# Patient Record
Sex: Female | Born: 1970 | Race: Asian | Hispanic: No | Marital: Married | Smoking: Never smoker
Health system: Southern US, Community
[De-identification: ages and names within clinical notes are randomized; demographics above are authoritative.]

## PROBLEM LIST (undated history)

## (undated) DIAGNOSIS — G43909 Migraine, unspecified, not intractable, without status migrainosus: Secondary | ICD-10-CM

## (undated) DIAGNOSIS — B977 Papillomavirus as the cause of diseases classified elsewhere: Secondary | ICD-10-CM

## (undated) DIAGNOSIS — IMO0002 Reserved for concepts with insufficient information to code with codable children: Secondary | ICD-10-CM

## (undated) DIAGNOSIS — J811 Chronic pulmonary edema: Secondary | ICD-10-CM

## (undated) DIAGNOSIS — I509 Heart failure, unspecified: Secondary | ICD-10-CM

## (undated) DIAGNOSIS — Z8742 Personal history of other diseases of the female genital tract: Secondary | ICD-10-CM

## (undated) DIAGNOSIS — I38 Endocarditis, valve unspecified: Secondary | ICD-10-CM

## (undated) HISTORY — DX: Personal history of other diseases of the female genital tract: Z87.42

## (undated) HISTORY — DX: Heart failure, unspecified: I50.9

## (undated) HISTORY — DX: Reserved for concepts with insufficient information to code with codable children: IMO0002

## (undated) HISTORY — DX: Migraine, unspecified, not intractable, without status migrainosus: G43.909

## (undated) HISTORY — PX: ABDOMINAL HYSTERECTOMY: SHX81

## (undated) HISTORY — PX: TUBAL LIGATION: SHX77

---
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
## 2006-04-13 ENCOUNTER — Other Ambulatory Visit: Admission: RE | Admit: 2006-04-13 | Discharge: 2006-04-13 | Payer: Self-pay | Admitting: Obstetrics and Gynecology

## 2006-11-02 ENCOUNTER — Ambulatory Visit: Payer: Self-pay | Admitting: Emergency Medicine

## 2006-11-02 ENCOUNTER — Inpatient Hospital Stay (HOSPITAL_COMMUNITY): Admission: AD | Admit: 2006-11-02 | Discharge: 2006-11-08 | Payer: Self-pay | Admitting: Obstetrics and Gynecology

## 2006-11-03 ENCOUNTER — Encounter (INDEPENDENT_AMBULATORY_CARE_PROVIDER_SITE_OTHER): Payer: Self-pay | Admitting: Specialist

## 2006-11-05 ENCOUNTER — Encounter (INDEPENDENT_AMBULATORY_CARE_PROVIDER_SITE_OTHER): Payer: Self-pay | Admitting: *Deleted

## 2006-11-09 ENCOUNTER — Encounter: Admission: RE | Admit: 2006-11-09 | Discharge: 2006-12-09 | Payer: Self-pay | Admitting: Obstetrics and Gynecology

## 2008-04-05 ENCOUNTER — Inpatient Hospital Stay (HOSPITAL_COMMUNITY): Admission: RE | Admit: 2008-04-05 | Discharge: 2008-04-09 | Payer: Self-pay | Admitting: Obstetrics and Gynecology

## 2008-04-05 ENCOUNTER — Encounter (INDEPENDENT_AMBULATORY_CARE_PROVIDER_SITE_OTHER): Payer: Self-pay | Admitting: Obstetrics and Gynecology

## 2008-04-07 ENCOUNTER — Encounter (INDEPENDENT_AMBULATORY_CARE_PROVIDER_SITE_OTHER): Payer: Self-pay | Admitting: Cardiology

## 2011-04-28 NOTE — Op Note (Signed)
[name], [name]               ACCOUNT NO.:  [id number]   MEDICAL RECORD NO.:  [id number]          PATIENT TYPE:  INP   LOCATION:  9128                          [facility]   PHYSICIAN:  Hal Morales, M.D.DATE OF BIRTH:  [birth year]   DATE OF PROCEDURE:  04/05/2008  DATE OF DISCHARGE:                               OPERATIVE REPORT   PREOPERATIVE DIAGNOSES:  1. Intrauterine pregnancy at 49 weeks' gestation.  2. Prior cesarean section.  3. Desire for repeat cesarean section.  4. Desire for surgical sterilization.  5. History of postpartum congestive heart failure secondary to mild      mitral regurgitation.   POSTOPERATIVE DIAGNOSES:  1. Intrauterine pregnancy at 15 weeks' gestation.  2. Prior cesarean section.  3. Desire for repeat cesarean section.  4. Desire for surgical sterilization.  5. History of postpartum congestive heart failure secondary to mild      mitral regurgitation.   OPERATION:  1. Repeat low transverse cesarean section.  2. Bilateral tubal sterilization.   SURGEON:  Hal Morales, M.D.   FIRST ASSISTANT:  Candice Denny Levy, [place].   ANESTHESIA:  Spinal.   ESTIMATED BLOOD LOSS:  750 mL.   COMPLICATIONS:  None.   FINDINGS:  The patient was delivered of a female infant with Apgars of 9  and 9 at one and five minutes, respectively.  The uterus, tubes and  ovaries were normal for the gravid state.  The placenta contained an  eccentrically-inserted three-vessel cord.   PROCEDURE:  The patient was taken to the operating room after  appropriate identification and placed on the operating table.  After the  placement of a spinal anesthetic, she was placed in the supine position  with a left lateral tilt.  The abdomen and perineum were prepped with  multiple layers of Betadine and a Foley catheter inserted into the  bladder and connected to straight drainage.  The abdomen was draped as  sterile field.  After assurance of adequate anesthesia,  the suprapubic  region was infiltrated with 20 mL of 0.25% Marcaine.  The site of the  previous cesarean section incision was incised on either side of the  incision and that old incision excised sharply.  The abdomen was opened  in layers and the peritoneum entered.  The bladder blade was placed and  the uterus was incised approximately 2 cm above the uterovesical fold.  That incision was taken laterally bluntly.  The infant was delivered  from the occiput transverse position with the aid of a Kiwi vacuum  extractor and after having the nares and pharynx suctioned and the cord  clamped and cut was handed off to the awaiting pediatricians.  The  placenta was allowed to separate from the uterus and was removed from  the operative field after the appropriate cord blood had been drawn.  The uterine cavity was cleared of products of conception with a  laparotomy pad and the uterine incision was closed with a running  interlocking suture of 0 Vicryl.  An imbricating suture of 0 Vicryl was  then placed.  Hemostatic sutures of 0  Vicryl allowed for adequate  hemostasis.  Copious irrigation was carried out.  The left fallopian  tube was identified, followed to its fimbriated end, then grasped at the  isthmic portion and elevated.  A suture of 2-0 chromic was placed  through the mesosalpinx and tied fore and aft on a knuckle of tube.  A  second ligature was placed proximal to that and the intervening knuckle  of tube was excised.  The cut ends were cauterized and hemostasis noted  to be adequate.  A similar procedure was carried out on the opposite  side.  A single suture of 2-0 Vicryl was used to reapproximate the  bladder flap and allow for adequate hemostasis.  The abdominal  peritoneum was closed with a running suture of 2-0 Vicryl.  The rectus  muscles were reapproximated in midline with a figure-of-eight suture of  2-0 Vicryl.  The rectus fascia was closed with a running suture of 0  Vicryl,  then reinforced on either side of midline with figure-of-eight  sutures of 0 Vicryl.  The subcutaneous tissue was irrigated and made  hemostatic with Bovie cautery.  A subcutaneous 7-mm Jackson-Pratt drain  was placed through a stab wound in the left lower quadrant.  It was sewn  in with a suture of 0 silk.  The skin incision was closed with a  subcuticular suture of 3-0 Monocryl.  A sterile dressing was applied  after Steri-Strips had been applied.  The grenade was applied to the  drain and the patient taken from the operating room to the recovery room  in satisfactory condition having tolerated the procedure well, with  sponge and instrument counts correct.   SPECIMENS TO PATHOLOGY:  Portions of right and left fallopian tube.      Hal Morales, M.D.  Electronically Signed     VPH/MEDQ  D:  04/05/2008  T:  04/05/2008  Job:  [id number]

## 2011-04-28 NOTE — H&P (Signed)
[name], [name]               ACCOUNT NO.:  [id number]   MEDICAL RECORD NO.:  [id number]          PATIENT TYPE:  MAT   LOCATION:  MATC                          [facility]  [facility]   PHYSICIAN:  Hal Morales, M.D.DATE OF BIRTH:  [birth year]   DATE OF ADMISSION:  04/05/2008  DATE OF DISCHARGE:                              HISTORY & PHYSICAL   The patient is a 40 year old Asian married female, gravida 2, para 1-0-0-  1, at 38-1/7 weeks for an [facility] of Apr 18, 2008, who presents with chief  complaint of regular contractions every 4-5 minutes since 4 a.m.  The  patient was seen in office for a labor evaluation and cervix was closed,  50% and high but bloody show was noted.  The patient had a repeat C-  section scheduled for May 1 with Dr. Pennie Rushing.  Blood pressure at office  was 130/90 and recheck on her side was 120/80.  She has been followed by  MD service at [facility].  Dr. Pennie Rushing is her primary MD.   History is remarkable for:  1. Previous C-section for failure to progress.  2. Late to care.  3. Mild mitral valve regurgitation.  4. Congestive heart failure intrapartum and postpartum with previous      labor and delivery with her first child.  5. Unsure LMP.  6. History of migraines.   PRENATAL LABS:  The patient's blood type is B+, Rh antibody screen was  negative.  Hemoglobin 13.1, hematocrit 39.2, platelets 311.  RPR  nonreactive.  Rubella titer immune.  Hepatitis surface antigen negative.  HIV nonreactive.  Pap May 02, 2007, was within normal limits.  Gonorrhea  and chlamydia cultures negative.  Her GBS status is negative per  patient.  She had an abnormal 1-hour GTT, which was 171.  She did have a  3-hour GTT which was within normal limits   PAST MEDICAL HISTORY:  The patient reports menarche at age 29.  She does  report 28-day cycles, 5 days in length, but was unsure of her LMP in  July.  She reports having rubella vaccine, had varicella as a child.  Did report increased blood  pressures in labor and delivery when she was  being induced with her first child but was out preeclampsia, however did  have congestive heart failure.  She did have an echocardiogram in  January 2008 showing mild mitral valve prolapse and mild regurgitation.  She was in AICU postpartally after her first delivery.  She had  postpartum anemia.  She does have a history of migraines.  Surgical  history includes primary low transverse cesarean section November 2007.  She has been hospitalized in the past for a fever.   ALLERGIES:  She denies medication or latex allergies or other  sensitivities.   FAMILY HISTORY:  Mother with varicosities.  Father insulin-dependent  diabetic.  Father, I believe, with some kind of addiction.  It is not  specified on her prenatal record.   GENETIC HISTORY:  Unremarkable with exception of a history of some twins  on, I think, her father's side of the family.  SOCIAL HISTORY:  The patient is a married Asian female.  Husband is  [name].  They report Catholic faith.  He is supportive of the  patient.  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  Father of baby has his Ph.D. in information systems and is a  professor at [employer].  She denies any alcohol, tobacco or illicit drug use.   HISTORY OF PRESENT PREGNANCY:  She entered care at [facility] on  November 21, 2007, for her new OB.  She was approximately 18-5/7 weeks  and had an ultrasound that day and EDC is set by that date 18-5/7-week  ultrasound, providing an [facility] of Apr 18, 2008.  Ultrasound at that time  showed SIUP with size approximately 18-5/7 weeks.  Cervix was 4.9 cm,  suggests a female, transverse lie, normal fluid, anterior placenta,  grade 0.  The patient did at that time desire repeat cesarean section  and desire for BTL.  She did desire amniocentesis.  She did have a quad  screen with her first pregnancy showing elevated risk for Down syndrome  but had normal  amnio subsequently.  Urine was sent for culture.  The  patient returned at 19-2/7 weeks for a complete anatomy ultrasound and  showed normal anatomy and normal growth and development.  She did  discuss at that time continue desire for amniocentesis, and that was  scheduled for November 25, 2007.  Amnio was attempted on November 25, 2007, but not completed secondary to fetal movement.  Patient seen at  then 22-2/7 weeks, scheduled to see Dr. Elsie Lincoln for a follow-up echo.  The patient did wind up receiving amniocentesis and showed 70 XX.  Echo  showed no mitral regurgitation, normal study.  She had a 1-hour GTT then  at 26-3/7 weeks, which was elevated at 171.  Her hemoglobin at that time  was 1.1.  She was doing well.  C-section was scheduled for May 1.  The  patient seen at 30 weeks, continued to do well.  Continued to desire  postpartum BTL.  She did have an upper respiratory infection around 31  weeks which recurred at approximately 36 weeks.  Rapid strep test was  done and her GBS cultures were done at 36 weeks, probable viral.  The  patient's pregnancy continued to progress from then without any other  complications.   OBJECTIVE:  VITAL SIGNS ON ADMISSION:  Her blood pressure at that  hospital 138/79, heart rate 94, temperature 98.2, respirations 18,  weight 155.  Height 5 feet.  EFM showed a fetal heart rate baseline of 135, moderate variability.  She was having fairly consistent variable decelerations with her  contractions with fetal heart rate into the 80s with a spontaneous  return to baseline.  Toco showing UC's every 2-6 minutes with some  ripples.  GENERAL:  Alert and oriented x3.  Discomfort with contractions.  HEENT:  The patient reports that she does have reading glasses but she  did not have them on today, no contacts, and otherwise grossly intact  and within normal limits.  CARDIOVASCULAR:  Regular rate and rhythm without murmur.  LUNGS:  Clear to auscultation  bilaterally.  ABDOMEN:  Soft, nontender, gravid.  Fundal height appropriate for  gestational age.  Estimated fetal weight 7-1/2 to 8 pounds.  PELVIC:  Deferred.  Cervix was closed, 50% and high in office was show.  EXTREMITIES:  No edema, no clonus.  DTRs 2+.  IMPRESSION:  1. Intrauterine pregnancy at 38-1/7 weeks.  2. Latent versus early labor.  3. Patient desiring repeat cesarean section.  4. Group beta strep negative.  5. Variable decelerations with contractions.   PLAN:  1. Per Dr. Pennie Rushing, recommend delivery and admission as inpatient with      Dr. Pennie Rushing as attending.  2. Risks, benefits, alternatives of repeat cesarean section discussed      with the patient and the patient desires to proceed with repeat      cesarean section and BTL.  3. Routine preoperative orders.      Candice [place], [place]      Hal Morales, M.D.  Electronically Signed    CHS/MEDQ  D:  04/05/2008  T:  04/05/2008  Job:  [id number]

## 2011-05-01 NOTE — Op Note (Signed)
NAMECRISTEL, Bernard               ACCOUNT NO.:  [id number]   MEDICAL RECORD NO.:  [id number]          PATIENT TYPE:  INP   LOCATION:  9373                          [facility]   PHYSICIAN:  Hal Morales, M.D.DATE OF BIRTH:  Nov 09, 1971   DATE OF PROCEDURE:  11/03/2006  DATE OF DISCHARGE:                               OPERATIVE REPORT   PREOPERATIVE DIAGNOSES:  Intrauterine pregnancy at 41 weeks' gestation,  failure to progress in labor, recurrent tachypnea tachycardia O2  desaturation, rule out pulmonary embolus.   POSTOPERATIVE DIAGNOSES:  Intrauterine pregnancy at 76 weeks' gestation,  failure to progress in labor, recurrent tachypnea tachycardia O2  desaturation, rule out pulmonary embolus.   OPERATION:  Primary low transverse cesarean section.   SURGEON:  Hal Morales, M.D.   FIRST ASSISTANT:  Wynelle Bourgeois, certified nurse midwife.   ANESTHESIA:  Epidural.   ESTIMATED BLOOD LOSS:  1000 mL.   COMPLICATIONS:  No surgical complications.   FINDINGS:  The patient was delivered of a female infant weighing 7  pounds 9 ounces with Apgars of 9 and 9 at 1 and 5 minutes respectively.  The uterus, tubes and ovaries were normal for the gravid state.   PROCEDURE:  The patient was taken to the operating room after  appropriate identification and placed on the operating table.  Discussion had been held with the patient and her husband concerning the  indications for her procedure being failure to progress in labor with  adequate labor having being been documented over the last 3 hours with  no change in cervix.  A discussion was also held concerning the risks  involved which include but are not limited to anesthesia, bleeding,  infection and damage to adjacent organs.  The patient was placed on the  operating table but immediately complained of shortness of breath and  became much more tachypneic with a respiratory rate of 40 and a heart  rate in the 110-130 range.   Her epidural was dosed for surgical  anesthesia and evaluation of her airway had been performed.  She was  then placed in the supine position with a left lateral tilt.  The  abdomen was prepped with multiple layers of Betadine and draped in a  sterile field.  Her labor Foley was already in place.  After assurance  of adequate anesthesia suprapubic region was infiltrated with 10 mL of  25% Marcaine.  A suprapubic incision was made in the abdomen and opened  in layers.  The peritoneum was entered and the bladder blade placed.  The uterus was incised approximately 2 cm above the uterovesical fold  and that incision taken laterally on either side bluntly.  The infant  was delivered from the occiput transverse position.  After having the  nares and pharynx suctioned and the cord clamped and cut was handed off  to the awaiting pediatricians.  The appropriate cord blood was drawn and  the placenta manually removed from the uterus.  Uterine incision was  closed with running interlocking suture of 0 Vicryl.  Imbricating suture  of zero Vicryl was placed with adequate  hemostasis.  Copious irrigation  was carried out and the abdominal peritoneum closed with a running  suture of 2-0 Vicryl.  Rectus muscles were reapproximated in the midline  with figure-of-eight suture of 2-0 Vicryl.  The rectus fascia was closed  with a running suture of 0 Vicryl then reinforced on either side of  midline with figure-of-eight sutures of 0 Vicryl.  The subcutaneous  tissue was made hemostatic with Bovie cautery and irrigated.  A  subcutaneous 7 mm Jackson-Pratt drain was placed through a stab incision  in the left lower quadrant and tied in place with a suture of 0 silk.  The skin incision was closed with skin staples and a sterile dressing  applied.  The patient was then managed by anesthesia to try to improve  O2 saturation with hand CPAP.  Once she was stabilized she was removed  from the operating room and taken  to the recovery room in satisfactory  condition having tolerated the procedure well with sponge and instrument  counts correct.  Plans were made for a spiral CT immediately  postoperatively.      Hal Morales, M.D.  Electronically Signed     VPH/MEDQ  D:  11/03/2006  T:  11/04/2006  Job:  [id number]

## 2011-05-01 NOTE — Discharge Summary (Signed)
[name], [name]               ACCOUNT NO.:  [id number]   MEDICAL RECORD NO.:  [id number]          PATIENT TYPE:  INP   LOCATION:  9373                          [facility]   PHYSICIAN:  Hal Morales, M.D.DATE OF BIRTH:  02/13/1971   DATE OF ADMISSION:  11/02/2006  DATE OF DISCHARGE:  11/08/2006                               DISCHARGE SUMMARY   ADMITTING DIAGNOSES:  1. Intrauterine pregnancy at 41-2/7th weeks.  2. Induction of labor post dates.   PREOPERATIVE DIAGNOSES:  1. Failure to progress.  2. Recurrent tachypnea.  3. Tachycardia.  4. O2 desaturation, rule out pulmonary embolus.   OPERATIVE PROCEDURE:  Primary low transverse cesarean section.   DISCHARGE DIAGNOSES:  1. Term pregnancy delivered.  2. Failure to progress in labor.  3. Primary low transverse cesarean section.  4. Respiratory distress.  5. Hypoxemia.  6. Anemia.  7. Mitral valve stenosis.  8. Mitral valve regurgitation.  9. Tricuspid valve regurgitation.  10.Congestive heart failure.  11.History of pulmonary infiltrate.   [name] is a 40 year old gravida 1, para 0 who presented at 41-2/7th  weeks for induction of labor secondary to post dates.  Her labor started  with receipt of Cytotec for cervical ripening on the evening of the 20th  with Pitocin started the morning of the 21st.  Initial evaluation found  cervix to be closed and thick.  On the morning of the 21st, cervix was  1, 30% and -3.  At 12:15 that afternoon, cervix was 1, 80%, -2 as the  latent phase continued.  At 1900 hours of the 21st, the patient was  noted to be 4-5 cm dilated, 90% with the vertex at a -2 station.  Artificial rupture of membranes was employed and an IUPC placed.  Immediately following artificial rupture of membranes, the patient  complained of being warm and having difficulty breathing.  O2 saturation  at that point in time was noted to be 85%.  Her vital signs showed a  temperature of 98.4, heart rate of  108-120 and a blood pressure of  144/69.  Respirations were 12.  O2 by face mask was utilized.  O2  saturation at that point showed 90-92% and came up to 99% with face  mask.  The patient was checked 1 hour following this and she remained 4-  5 cm dilated with [place] units in light of failure to progress in  labor.  The patient at this point was also noted to have elevated blood  pressure.  PIH labs returned normal with no evidence of preeclampsia.  C-  section was recommended and accepted by the patient.  Following  stabilization in the PACU, the patient underwent spiral CT to rule out  pulmonary embolus.  The patient continued breathing comfortably in the  PACU with CPAP.  CT scan showed no PE with bilateral pleural effusions,  right greater than left and patchy air space opacities in the right  upper lobe.  Pulmonary consult and cardiology consult were received and  the patient remained stable in the adult intensive care unit.  She was  treated with  antibiotics prophylactically.  Two-D echocardiogram showed  mitral valve stenosis, mitral valve regurgitation and tricuspid valve  regurgitation.  The patient's congestive heart failure was treated with  diuretics and Lopressor.  The patient's condition began to stabilize.  Her CBC postoperatively showed  WBCs with a high at 17.5 on November 04, 2006 and decreased to 15.0 on the 25th.  Her hemoglobin stabilized at  8.6, hematocrit 25 and platelets 302,000.  ESR done on the 22nd was 37.  PIH labs were within normal limits.  ANA titer was negative.  B-type  natriuretic peptide 91 on the 22nd and 48.6 on the 25th.  Hemoglobin A1c  5.6.  Lipid profile cholesterol 175, triglycerides 265, HDL cholesterol  57, total cholesterol HDL ratio 3.1, VLDL cholesterol 53, LDL  cholesterol 65.  On postop day #5, the patient had been stable x24 hours  or greater with no shortness of breath, no tachypnea, or tachycardia and  her vital signs remained  stable with her heart rate 90-100 in normal  sinus rhythm.  Two-D echocardiogram was repeated on November 08, 2006  postop day #5 and remained unchanged from previous evaluation.  In light  of the patient's stable condition postoperatively and from pulmonary and  cardiology consult standpoint, she is to be discharged home.   DISCHARGE DIAGNOSIS:  Discharge diagnoses as described above.   DISCHARGE MEDICATIONS:  1. Percocet 1-2 tablets every 4 hours p.r.n. pain.  2. Motrin 600 mg p.o. q.6h. p.r.n. pain.  3. Lopressor 25 mg p.o. twice daily at 10 a.m. and 10 p.m.  4. Iron sulfate over the counter 325 mg twice daily.   The patient will follow up at the office of Dr. Elsie Lincoln at 11 a.m. on  November 19, 2006 and she will follow up with Dr. Pennie Rushing at the office  of [facility] at 11:45 on November 24, 2006.      Rica Koyanagi, C.N.M.      Hal Morales, M.D.  Electronically Signed    SDM/MEDQ  D:  11/08/2006  T:  11/08/2006  Job:  [id number]   cc:   Madaline Savage, M.D.  Fax: [phone]

## 2011-05-01 NOTE — Discharge Summary (Signed)
[name], [name]               ACCOUNT NO.:  [id number]   MEDICAL RECORD NO.:  [id number]          PATIENT TYPE:  INP   LOCATION:  9371                          FACILITY:  [facility]   PHYSICIAN:  Hal Morales, M.D.DATE OF BIRTH:  1971-02-08   DATE OF ADMISSION:  04/05/2008  DATE OF DISCHARGE:  04/09/2008                               DISCHARGE SUMMARY   DISCHARGING PHYSICIAN:  Dois Davenport A. Rivard, MD   ADMISSION DIAGNOSES:  1. Intrauterine pregnancy at 38-1/7 weeks.  2. Early labor.  3. Desires repeat cesarean section.  4. Group B strep negative.  5. Variable deceleration of the fetal heart rate.   DISCHARGE DIAGNOSES:  1. Intrauterine pregnancy at 38-1/7 weeks.  2. Early labor.  3. Desires repeat cesarean section.  4. Group B strep negative.  5. Variable deceleration of the fetal heart rate.  6. Status post repeat low transverse cesarean section and bilateral      tubal ligation of a female infant named Redmond Baseman, Apgars 9 and 9      weighing 6 pounds 12 ounces.  7. Mitral regurgitation.  8. Pulmonary edema.   HOSPITAL PROCEDURES:  1. Spinal anesthesia.  2. Repeat low transverse cesarean section.  3. Bilateral tubal ligation.  4. ICU care.   HOSPITAL COURSE:  The patient was admitted in early labor with  contractions.  Fetal heart rate was reassuring, but was having some  variable decelerations.  The patient had requested a repeat C-section  and this was performed under spinal anesthesia by Dr. Pennie Rushing for a  female infant, Apgars 9 and 9, weighing 6 pounds 12 ounces.  She was  taken to recovery and then to the AICU for postop care.  On postop day  #1, she was doing well.  The incision was clean, dry, and intact.  She  was seen by the cardiologist because of a history of congestive heart  failure in 2007 with mitral valve thickening and they helped to  supervise her care and on postop day #2, she was doing well.  There was  no shortness of breath, blood pressures were  140/90, oxygen saturation  was 96%, and incision was healing.  She had a chest x-ray that showed  some pulmonary edema and Lasix was given to treat that and she was  placed on 2 liters of oxygen.  Cardiology was reconsulted and  recommended continuing Lasix and holding her beta-blocker medication due  to low heart rate and pulmonary edema treatment.  Cardiac enzymes were  gathered and on postop day # 3, she was doing well.  There was no  shortness of breath or chest pain.  She was tolerating regular diet.  The pain was well controlled.  She was on lisinopril and Lopressor for  her blood pressure maintenance then was 130s over 70s.  Labs were within  normal limits.  Cardiac enzymes were within normal limits and troponin  was also normal.  On postop day #4, she was ready to go home.  She had  no dyspnea or chest pain.  Vital signs were stable.  Blood pressures  were 120s  to 140s over 60s to low 90s.  Oxygen saturation was 96-99%.  INR was within normal limits.  Lungs were clear.  Heart had regular rate  and rhythm.  Abdomen is soft and appropriately tender.  Incision was  clean, dry, and intact.  JP was removed.  Lochia was small.  Extremities  within normal limits.  Her labs were within normal limits and BNP was  less than 30.  She was deemed to have received full benefit of her  hospital stay and was discharged home.   DISCHARGE MEDICATIONS:  Motrin 600 mg p.o. q.6 h. p.r.n. and Tylox 1 to  2 p.o. q.4 h. p.r.n.   DISCHARGE INSTRUCTIONS:  Per [facility] handout.   DISCHARGE LABS:  Echocardiogram showed LV size normal.  Ejection  fraction 65%.  White blood cell count was 10.1, hemoglobin was 12.9,  platelets 279, potassium 3.9, creatinine 0.62, and cardiac markers were  all normal.  Discharge followup in 1-2 weeks with cardiology, and 6  weeks at [facility] or p.r.n.      Marie L. Williams, C.N.M.      Hal Morales, M.D.  Electronically Signed    MLW/MEDQ  D:  05/09/2008   T:  05/10/2008  Job:  [id number]

## 2011-05-01 NOTE — H&P (Signed)
[name], [name]               ACCOUNT NO.:  [id number]   MEDICAL RECORD NO.:  [id number]          PATIENT TYPE:  INP   LOCATION:  9173                          [facility]  [facility]   PHYSICIAN:  Hal Morales, M.D.DATE OF BIRTH:  1971-05-17   DATE OF ADMISSION:  11/02/2006  DATE OF DISCHARGE:                              HISTORY & PHYSICAL   This is a 40 year old gravida 1, para 0 at 41-2/7 weeks who presents for  induction of labor secondary to post dates.  She denies leaking or  bleeding, reports positive fetal movement.  Pregnancy has been followed  by Dr. Pennie Rushing and remarkable for  1. Migraines.  2. Increased Down's Syndrome risk on quad screen with normal      amniocentesis.   ALLERGIES:  NO KNOWN DRUG ALLERGIES.   OBSTETRICAL HISTORY:  Patient is primigravida.   PAST MEDICAL HISTORY:  1. Remarkable for childhood varicella.  2. History of migraine headaches.   FAMILY HISTORY:  Remarkable for mother with varicosities, father with  diabetes.  Father with nicotine addiction.   GENETIC HISTORY:  Remarkable for grandfather with twins.   SOCIAL HISTORY:  Patient is married to [facility] who is involved  and supportive.  She is of catholic faith.  She denies any alcohol,  tobacco or drug use.   PRENATAL LABS:  Hemoglobin 13.4, platelets 320.  Blood type B positive.  Antibody screen negative.  Sickle cell negative.  RPR nonreactive.  Rubella immune.  Hepatitis negative.  HIV declined.  Varicella immune.  Rubeola and mumps both immune.   HISTORY OF CURRENT PREGNANCY:  Patient entered care at [redacted] weeks  gestation.  She had equivocal Chlamydia result at new OB which was  repeated and found to be negative.  She had quad screen at 18 weeks  which was remarkable for increased Down's syndrome risk.  She had an  ultrasound at that time that was normal except for cardiac anatomy was  poorly seen so she had another ultrasound later which was normal.  She  did agree to  amniocentesis which was done at 19 weeks and returned  chromosome results of 32 XX normal chromosomes, AFP was normal.  She had  a Glucola that was 130.  She had a growth ultrasound at 32 weeks showing  66.8 percentile growth and then she presents for induction today.   REVIEW OF SYSTEMS:  Patient received Cytotec last night and Pitocin this  morning.  She now reports painful contractions.   OBJECTIVE:  VITAL SIGNS:  Stable and afebrile.  HEENT:  Within normal limits.  NECK:  Thyroid normal, not enlarged.  CHEST:  Clear to auscultation.  CARDIOVASCULAR:  Regular rate and rhythm.  ABDOMEN:  Gravid at 40 cm, vertex to Leopold's, EFM shows reactive fetal  heart rate with uterine contractions every 2-3 minutes.  CERVIX:  On admission, was closed, thick and -3 and now is 1, 30% and -3  with vertex presentation.  EXTREMITIES:  Within normal limits.   ASSESSMENT:  1. Intrauterine pregnancy at 41-2/7 weeks.  2. Post dates.  3. Induction of labor.  PLAN:  1. Admit to birthing suites.  2. Induction per orders, Dr. Pennie Rushing aware.      Marie L. Williams, C.N.M.      Hal Morales, M.D.  Electronically Signed    MLW/MEDQ  D:  11/03/2006  T:  11/03/2006  Job:  [id number]

## 2011-09-08 LAB — COMPREHENSIVE METABOLIC PANEL
ALT: 10
BUN: 3 — ABNORMAL LOW
Calcium: 8.2 — ABNORMAL LOW
Creatinine, Ser: 0.54
Glucose, Bld: 104 — ABNORMAL HIGH
Sodium: 136
Total Protein: 4.9 — ABNORMAL LOW

## 2011-09-08 LAB — CARDIAC PANEL(CRET KIN+CKTOT+MB+TROPI)
CK, MB: 2.9
CK, MB: 5.2 — ABNORMAL HIGH
Relative Index: 2.4
Relative Index: 3.7 — ABNORMAL HIGH
Total CK: 122
Total CK: 139
Troponin I: 0.01

## 2011-09-08 LAB — BASIC METABOLIC PANEL
CO2: 25
CO2: 26
Chloride: 104
GFR calc Af Amer: >60
GFR calc non Af Amer: >60
Glucose, Bld: 78
Glucose, Bld: 83
Potassium: 3.9
Potassium: 3.9
Sodium: 137
Sodium: 138

## 2011-09-08 LAB — B-NATRIURETIC PEPTIDE (CONVERTED LAB): Pro B Natriuretic peptide (BNP): <30

## 2011-09-08 LAB — CBC
HCT: 36.7
Hemoglobin: 10.9 — ABNORMAL LOW
Hemoglobin: 12.9
Hemoglobin: 12.9
MCHC: 34.9
RBC: 4.28
RDW: 14.9
RDW: 15.3

## 2011-09-08 LAB — RPR: RPR Ser Ql: NONREACTIVE

## 2017-09-30 ENCOUNTER — Other Ambulatory Visit: Payer: Self-pay | Admitting: Obstetrics and Gynecology

## 2018-04-14 ENCOUNTER — Other Ambulatory Visit: Payer: Self-pay | Admitting: Obstetrics and Gynecology

## 2018-04-14 DIAGNOSIS — N925 Other specified irregular menstruation: Secondary | ICD-10-CM

## 2018-04-19 ENCOUNTER — Ambulatory Visit
Admission: RE | Admit: 2018-04-19 | Discharge: 2018-04-19 | Disposition: A | Discharge status: Home / Self Care | Payer: BC Managed Care – PPO | Source: Ambulatory Visit | Attending: Obstetrics and Gynecology | Admitting: Obstetrics and Gynecology

## 2018-04-19 DIAGNOSIS — N925 Other specified irregular menstruation: Secondary | ICD-10-CM

## 2018-04-26 ENCOUNTER — Other Ambulatory Visit: Payer: Self-pay | Admitting: Obstetrics and Gynecology

## 2018-10-20 ENCOUNTER — Other Ambulatory Visit: Payer: Self-pay

## 2018-10-20 ENCOUNTER — Emergency Department (HOSPITAL_COMMUNITY)
Admission: EM | Admit: 2018-10-20 | Discharge: 2018-10-20 | Disposition: A | Discharge status: Home / Self Care | Payer: BC Managed Care – PPO | Attending: Emergency Medicine | Admitting: Emergency Medicine

## 2018-10-20 ENCOUNTER — Encounter (HOSPITAL_COMMUNITY): Payer: Self-pay | Admitting: *Deleted

## 2018-10-20 DIAGNOSIS — R040 Epistaxis: Secondary | ICD-10-CM | POA: Insufficient documentation

## 2018-10-20 MED ORDER — OXYMETAZOLINE HCL 0.05 % NA SOLN
1.0000 | Freq: Once | NASAL | Status: AC
  Administered 2018-10-20: 1 via NASAL
  Filled 2018-10-20: qty 15

## 2018-10-20 MED ORDER — SALINE SPRAY 0.65 % NA SOLN
1.0000 | NASAL | 0 refills | Status: AC | PRN
Start: 2018-10-20 — End: ?

## 2018-10-20 NOTE — ED Provider Notes (Signed)
[facility] EMERGENCY DEPARTMENT Provider Note   CSN: [id number] Arrival date & time: 10/20/18  0041     History   Chief Complaint Chief Complaint  Patient presents with  . Epistaxis    HPI [name] is a 47 y.o. female.  Patient presents to the emergency department with a chief complaint of epistaxis.  She states that she has been having intermittent bleeding from her right nostril since 12 noon.  She does not take any blood thinners.  She has had intermittent success with applying pressure.  She has not applied ice or tried any other interventions.  She denies any history of nosebleeds.  Denies any trauma to the area.  She denies any fevers.  Denies headache.  The history is provided by the patient. No language interpreter was used.    History reviewed. No pertinent past medical history.  There are no active problems to display for this patient.   History reviewed. No pertinent surgical history.   OB History   None      Home Medications    Prior to Admission medications   Not on File    Family History No family history on file.  Social History Social History   Tobacco Use  . Smoking status: Never Smoker  . Smokeless tobacco: Current User  Substance Use Topics  . Alcohol use: Yes  . Drug use: Never     Allergies   Patient has no known allergies.   Review of Systems Review of Systems  All other systems reviewed and are negative.    Physical Exam Updated Vital Signs BP (!) 170/108 (BP Location: Right Arm)   Pulse (!) 115   Temp 97.7 F (36.5 C) (Oral)   Resp 17   Ht 5' (1.524 m)   Wt 68 kg   SpO2 98%   BMI 29.29 kg/m   Physical Exam  Constitutional: She is oriented to person, place, and time. She appears well-developed and well-nourished.  HENT:  Head: Normocephalic and atraumatic.  Active bleeding from right nares, unable to visualize source, despite repeated attempts of blowing nose and the use of afrin nasal  spray  Eyes: Conjunctivae and EOM are normal.  Neck: Normal range of motion.  Cardiovascular: Normal rate.  Pulmonary/Chest: Effort normal.  Abdominal: She exhibits no distension.  Musculoskeletal: Normal range of motion.  Neurological: She is alert and oriented to person, place, and time.  Skin: Skin is dry.  Psychiatric: She has a normal mood and affect. Her behavior is normal. Judgment and thought content normal.  Nursing note and vitals reviewed.    ED Treatments / Results  Labs (all labs ordered are listed, but only abnormal results are displayed) Labs Reviewed - No data to display  EKG None  Radiology No results found.  Procedures Procedures (including critical care time)  Medications Ordered in ED Medications  oxymetazoline (AFRIN) 0.05 % nasal spray 1 spray (has no administration in time range)     Initial Impression / Assessment and Plan / ED Course  I have reviewed the triage vital signs and the nursing notes.  Pertinent labs & imaging results that were available during my care of the patient were reviewed by me and considered in my medical decision making (see chart for details).     Patient with epistaxis since yesterday at around noon.  No traumatic injuries.  Not anticoagulated.  Bleeding controlled with Afrin nasal spray and pressure.  Patient was observed for a satisfactory amount of  time in the emergency department with no recurrence of the bleeding.  Charged home with instructions for epistaxis care and also advised nasal saline to moisten nasal mucosa.  Final Clinical Impressions(s) / ED Diagnoses   Final diagnoses:  Epistaxis    ED Discharge Orders         Ordered    sodium chloride (OCEAN) 0.65 % SOLN nasal spray  As needed     10/20/18 0222           Montine Circle, PA-C 61/53/79 [id number]    Delora Fuel, MD 61/47/09 2248

## 2018-10-20 NOTE — ED Notes (Signed)
Nose no longer bleeding

## 2018-10-20 NOTE — ED Triage Notes (Signed)
The pt has had a nose bleed intermittently since 1200 n today.  It has been bleeding now since 2200  No injury  No previous history

## 2019-05-18 ENCOUNTER — Other Ambulatory Visit: Payer: Self-pay | Admitting: Family Medicine

## 2019-05-18 DIAGNOSIS — R590 Localized enlarged lymph nodes: Secondary | ICD-10-CM

## 2019-05-26 ENCOUNTER — Ambulatory Visit
Admission: RE | Admit: 2019-05-26 | Discharge: 2019-05-26 | Disposition: A | Discharge status: Home / Self Care | Payer: BC Managed Care – PPO | Source: Ambulatory Visit | Attending: Family Medicine | Admitting: Family Medicine

## 2019-05-26 DIAGNOSIS — R590 Localized enlarged lymph nodes: Secondary | ICD-10-CM

## 2019-06-26 ENCOUNTER — Other Ambulatory Visit: Payer: Self-pay | Admitting: Family Medicine

## 2019-06-26 DIAGNOSIS — R1319 Other dysphagia: Secondary | ICD-10-CM

## 2019-06-30 ENCOUNTER — Other Ambulatory Visit: Payer: Self-pay | Admitting: Family Medicine

## 2019-06-30 ENCOUNTER — Ambulatory Visit
Admission: RE | Admit: 2019-06-30 | Discharge: 2019-06-30 | Disposition: A | Discharge status: Home / Self Care | Payer: BC Managed Care – PPO | Source: Ambulatory Visit | Attending: Family Medicine | Admitting: Family Medicine

## 2019-06-30 DIAGNOSIS — R1319 Other dysphagia: Secondary | ICD-10-CM

## 2020-05-07 ENCOUNTER — Telehealth: Payer: Self-pay | Admitting: General Practice

## 2020-05-07 NOTE — Telephone Encounter (Signed)
I spoke with the [name] to clarify her cardiac history and she developed SOB and pulmonary edema with her pregnancies in 2007 and 2009. The [name] denies a heart valve issue.  I asked the [name] if she has undergone any recent cardiac tests and she denies having any tests.  The [name] needs cardiac clearance for hysterectomy.  Apt scheduled with Dr Angelena Form on 05/08/20.

## 2020-05-07 NOTE — Progress Notes (Signed)
Chief Complaint  Patient presents with  . New Patient (Initial Visit)    pre-operative cardiovascular examination   History of Present Illness: 49 yo female with history of migraines and prior CHF during pregnancy who is here today as a new patient for pre-operative cardiovascular assessment prior to a planned hysterectomy. She has been having dysmenorrhea. She reports having had pulmonary edema during her pregnancy in 2007 and in 2009. She was seen at that time in cardiology by Dr. Melvern Banker. She thinks her echo was normal. No cardiac issues since 2009. She feels great. She denies any chest pain, dyspnea, palpitations, lower extremity edema, orthopnea, PND, dizziness, near syncope or syncope.   Upcoming hysterectomy with Dr. Katharine Look Rivard ([place]).   Primary Care Physician: [facility]   Past Medical History:  Diagnosis Date  . CHF (congestive heart failure) (HCC)    DURING PREGNANCY  . History of menorrhagia   . 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 Migraine     Past Surgical History:  Procedure Laterality Date  . CESAREAN SECTION     x 2  . TUBAL LIGATION      Current Outpatient Medications  Medication Sig Dispense Refill  . ibuprofen (ADVIL) 200 MG tablet Take 200 mg by mouth every 6 (six) hours as needed.    . Omega-3 Fatty Acids (FISH OIL PO) Take by mouth.    . sodium chloride (OCEAN) 0.65 % SOLN nasal spray Place 1 spray into both nostrils as needed for congestion. 1 Bottle 0   No current facility-administered medications for this visit.    No Known Allergies  Social History   Socioeconomic History  . Marital status: Married    Spouse name: Not on file  . Number of children: 2  . Years of education: Not on file  . Highest education level: Not on file  Occupational History  . Occupation: She is a Pharmacist, hospital  Tobacco Use  . Smoking status: Never Smoker  . Smokeless tobacco: Current User  Substance and Sexual  Activity  . Alcohol use: Yes    Comment: Social   . Drug use: Never  . Sexual activity: Not on file  Other Topics Concern  . Not on file  Social History Narrative  . Not on file   Social Determinants of Health   Financial Resource Strain:   . Difficulty of Paying Living Expenses:   Food Insecurity:   . Worried About Charity fundraiser in the Last Year:   . Arboriculturist in the Last Year:   Transportation Needs:   . Film/video editor (Medical):   Marland Kitchen Lack of Transportation (Non-Medical):   Physical Activity:   . Days of Exercise per Week:   . Minutes of Exercise per Session:   Stress:   . Feeling of Stress :   Social Connections:   . Frequency of Communication with Friends and Family:   . Frequency of Social Gatherings with Friends and Family:   . Attends Religious Services:   . Active Member of Clubs or Organizations:   . Attends Archivist Meetings:   Marland Kitchen Marital Status:   Intimate Partner Violence:   . Fear of Current or Ex-Partner:   . Emotionally Abused:   Marland Kitchen Physically Abused:   . Sexually Abused:     Family History  Problem Relation Age of Onset  . Other Mother        MALIGNANT TUMOR  OF COLON  . Hypercholesterolemia Mother   . Diabetes Father     Review of Systems:  As stated in the HPI and otherwise negative.   BP 124/80   Pulse 80   Ht 5' (1.524 m)   Wt 154 lb (69.9 kg)   SpO2 99%   BMI 30.08 kg/m   Physical Examination: General: Well developed, well nourished, NAD  HEENT: OP clear, mucus membranes moist  SKIN: warm, dry. No rashes. Neuro: No focal deficits  Musculoskeletal: Muscle strength 5/5 all ext  Psychiatric: Mood and affect normal  Neck: No JVD, no carotid bruits, no thyromegaly, no lymphadenopathy.  Lungs:Clear bilaterally, no wheezes, rhonci, crackles Cardiovascular: Regular rate and rhythm. No murmurs, gallops or rubs. Abdomen:Soft. Bowel sounds present. Non-tender.  Extremities: No lower extremity edema. Pulses are 2  + in the bilateral DP/PT.  EKG:  EKG is ordered today. The ekg ordered today demonstrates sinus, 80 bpm  Recent Labs: No results found for requested labs within last 8760 hours.   Lipid Panel No results found for: CHOL, TRIG, HDL, CHOLHDL, VLDL, LDLCALC, LDLDIRECT   Wt Readings from Last 3 Encounters:  05/08/20 154 lb (69.9 kg)  10/20/18 150 lb (68 kg)     Assessment and Plan:   1. Pre-operative cardiovascular examination: History of pulmonary edema during pregnancy with reported normal echo then. She feels great. No evidence of CHF. No chest pain or dyspnea. EKG is normal. Will arrange an echo to exclude structural heart disease and assess LV systolic function.    Current medicines are reviewed at length with the patient today.  The patient does not have concerns regarding medicines.  The following changes have been made:  no change  Labs/ tests ordered today include:   Orders Placed This Encounter  Procedures  . EKG 12-Lead  . ECHOCARDIOGRAM COMPLETE     Disposition:   F/U with me as needed.    Signed, Lauree Chandler, MD 05/08/2020 9:17 AM    [facility] [address], [place], [place]  [address] Phone: [phone]; Fax: [phone]

## 2020-05-07 NOTE — Telephone Encounter (Signed)
New Message    Cecille Rubin is calling from [facility] and is wanting to schedule a STAT referral for a heart valve disorder     Please call

## 2020-05-08 ENCOUNTER — Ambulatory Visit: Payer: BC Managed Care – PPO | Admitting: Cardiovascular Disease

## 2020-05-08 ENCOUNTER — Encounter: Payer: Self-pay | Admitting: Cardiovascular Disease

## 2020-05-08 ENCOUNTER — Other Ambulatory Visit: Payer: Self-pay

## 2020-05-08 VITALS — BP 124/80 | HR 80 | Ht 60.0 in | Wt 154.0 lb

## 2020-05-08 DIAGNOSIS — Z01810 Encounter for preprocedural cardiovascular examination: Secondary | ICD-10-CM

## 2020-05-08 NOTE — Patient Instructions (Addendum)
Medication Instructions:  No changes *If you need a refill on your cardiac medications before your next appointment, please call your pharmacy*   Lab Work: none If you have labs (blood work) drawn today and your tests are completely normal, you will receive your results only by: Marland Kitchen MyChart Message (if you have MyChart) OR . A paper copy in the mail If you have any lab test that is abnormal or we need to change your treatment, we will call you to review the results.   Testing/Procedures: Your physician has requested that you have an echocardiogram. Echocardiography is a painless test that uses sound waves to create images of your heart. It provides your doctor with information about the size and shape of your heart and how well your heart's chambers and valves are working. This procedure takes approximately one hour. There are no restrictions for this procedure.   Follow-Up: Your physician recommends that you schedule a follow-up appointment as needed with Dr. Angelena Form   Other Instructions

## 2020-05-16 ENCOUNTER — Other Ambulatory Visit: Payer: Self-pay | Admitting: Obstetrics and Gynecology

## 2020-05-30 ENCOUNTER — Ambulatory Visit (HOSPITAL_COMMUNITY): Payer: BC Managed Care – PPO | Attending: Cardiovascular Disease

## 2020-05-30 ENCOUNTER — Other Ambulatory Visit: Payer: Self-pay

## 2020-05-30 DIAGNOSIS — Z01810 Encounter for preprocedural cardiovascular examination: Secondary | ICD-10-CM | POA: Diagnosis not present

## 2020-06-05 NOTE — Progress Notes (Addendum)
PCP [facility] Physician at [facility]- Dr. Dawson Bills   Cardiologist - Lauree Chandler w/cardiac clearance dated 05-30-20 in Echo results  No back stimulator  PPM/ICD -  Device Orders -  Rep Notified -   Chest x-ray -  EKG - 05-08-20 Stress Test -  ECHO - 05-30-20 Cardiac Cath -   Sleep Study -  CPAP -   Fasting Blood Sugar -  Checks Blood Sugar _____ times a day  Blood Thinner Instructions: Aspirin Instructions:  ERAS Protcol - PRE-SURGERY Ensure or G2-   COVID TEST-   COVID Vaccination x 2 Pfizer  Exercises 45 minutes per day   Anesthesia review:   Patient denies shortness of breath, fever, cough and chest pain at PAT appointment   All instructions explained to the patient, with a verbal understanding of the material. Patient agrees to go over the instructions while at home for a better understanding. Patient also instructed to self quarantine after being tested for COVID-19. The opportunity to ask questions was provided.

## 2020-06-05 NOTE — Patient Instructions (Addendum)
YOU ARE SCHEDULED FOR A COVID TEST 06-18-20@ 9:05 AM. THIS TEST MUST BE DONE BEFORE SURGERY. GO TO  [address], [place], [address] AND REMAIN IN YOUR CAR, THIS IS A DRIVE UP TEST. ONCE YOUR COVID TEST IS DONE PLEASE FOLLOW ALL THE QUARANTINE  INSTRUCTIONS GIVEN IN YOUR HANDOUT.      Your procedure is scheduled on  06-20-20   Report to [place] AT  9:50 A. M.   Call this number if you have problems the morning of surgery  :[phone].   OUR ADDRESS IS [address].  WE ARE LOCATED IN [facility].                                     REMEMBER: AFTER MIDNIGHT THE NIGHT PRIOR TO SURGERY. NOTHING BY MOUTH EXCEPT CLEAR LIQUIDS UNTIL 8:50 AM . PLEASE FINISH ENSURE DRINK PER SURGEON ORDER  WHICH NEEDS TO BE COMPLETED AT 8:50 AM .   CLEAR LIQUID DIET   Foods Allowed                                                                     Foods Excluded  Coffee and tea, regular and decaf                             liquids that you cannot  Plain Jell-O any favor except red or purple                                           see through such as: Fruit ices (not with fruit pulp)                                     milk, soups, orange juice  Iced Popsicles                                    All solid food Carbonated beverages, regular and diet                                    Cranberry, grape and apple juices Sports drinks like Gatorade Lightly seasoned clear broth or consume(fat free) Sugar, honey syrup  _____________________________________________________________________    YOU MAY  BRUSH YOUR TEETH MORNING OF SURGERY AND RINSE YOUR MOUTH OUT, NO CHEWING GUM CANDY OR MINTS.   TAKE THESE MEDICATIONS MORNING OF SURGERY WITH A SIP OF WATER:  None  IF YOU ARE SPENDING THE NIGHT AFTER SURGERY PLEASE BRING ALL YOUR PRESCRIPTION MEDICATIONS IN THEIR ORIGINAL BOTTLES.   1 VISITOR IS ALLOWED IN WAITING ROOM ONLY DAY OF SURGERY.   NO VISITOR MAY  SPEND THE NIGHT.   VISITOR ARE ALLOWED TO STAY UNTIL 800 PM.  DO NOT WEAR JEWERLY, MAKE UP, OR NAIL POLISH ON FINGERNAILS.  DO NOT WEAR LOTIONS, POWDERS, PERFUMES OR DEODORANT.  DO NOT SHAVE FOR 24 HOURS PRIOR TO DAY OF SURGERY.  CONTACTS, GLASSES, OR DENTURES MAY NOT BE WORN TO SURGERY.                                    [facility] IS NOT RESPONSIBLE  FOR ANY BELONGINGS.                                                                    Marland Kitchen                                                                                                    [facility] - Preparing for Surgery Before surgery, you can play an important role.  Because skin is not sterile, your skin needs to be as free of germs as possible.  You can reduce the number of germs on your skin by washing with CHG (chlorahexidine gluconate) soap before surgery.  CHG is an antiseptic cleaner which kills germs and bonds with the skin to continue killing germs even after washing. Please DO NOT use if you have an allergy to CHG or antibacterial soaps.  If your skin becomes reddened/irritated stop using the CHG and inform your nurse when you arrive at Short Stay. Do not shave (including legs and underarms) for at least 48 hours prior to the first CHG shower.  You may shave your face/neck. Please follow these instructions carefully:  1.  Shower with CHG Soap the night before surgery and the  morning of Surgery.  2.  If you choose to wash your hair, wash your hair first as usual with your  normal  shampoo.  3.  After you shampoo, rinse your hair and body thoroughly to remove the  shampoo.                           4.  Use CHG as you would any other liquid soap.  You can apply chg directly  to the skin and wash                       Gently with a scrungie or clean washcloth.  5.  Apply the CHG Soap to your body ONLY FROM THE NECK DOWN.   Do not use on face/ open                           Wound or open sores.  Avoid contact with eyes, ears mouth and genitals (private parts).  Wash face,  Genitals (private parts) with your normal soap.             6.  Wash thoroughly, paying special attention to the area where your surgery  will be performed.  7.  Thoroughly rinse your body with warm water from the neck down.  8.  DO NOT shower/wash with your normal soap after using and rinsing off  the CHG Soap.                9.  Pat yourself dry with a clean towel.            10.  Wear clean pajamas.            11.  Place clean sheets on your bed the night of your first shower and do not  sleep with pets. Day of Surgery : Do not apply any lotions/deodorants the morning of surgery.  Please wear clean clothes to the hospital/surgery center.  FAILURE TO FOLLOW THESE INSTRUCTIONS MAY RESULT IN THE CANCELLATION OF YOUR SURGERY PATIENT SIGNATURE_________________________________  NURSE SIGNATURE__________________________________  ________________________________________________________________________

## 2020-06-07 ENCOUNTER — Other Ambulatory Visit: Payer: Self-pay

## 2020-06-07 ENCOUNTER — Encounter (HOSPITAL_COMMUNITY)
Admission: RE | Admit: 2020-06-07 | Discharge: 2020-06-07 | Disposition: A | Discharge status: Home / Self Care | Payer: BC Managed Care – PPO | Source: Ambulatory Visit | Attending: Obstetrics and Gynecology | Admitting: Obstetrics and Gynecology

## 2020-06-07 ENCOUNTER — Encounter (HOSPITAL_COMMUNITY): Payer: Self-pay | Admitting: *Deleted

## 2020-06-07 DIAGNOSIS — Z01818 Encounter for other preprocedural examination: Secondary | ICD-10-CM | POA: Insufficient documentation

## 2020-06-07 HISTORY — DX: Chronic pulmonary edema: J81.1

## 2020-06-07 LAB — BASIC METABOLIC PANEL
Anion gap: 9 (ref 5–15)
BUN: 9 mg/dL (ref 6–20)
CO2: 25 mmol/L (ref 22–32)
Calcium: 8.8 mg/dL — ABNORMAL LOW (ref 8.9–10.3)
Chloride: 104 mmol/L (ref 98–111)
Creatinine, Ser: 0.67 mg/dL (ref 0.44–1.00)
GFR calc Af Amer: >60 mL/min (ref >60)
GFR calc non Af Amer: >60 mL/min (ref >60)
Glucose, Bld: 127 mg/dL — ABNORMAL HIGH (ref 70–99)
Potassium: 3.8 mmol/L (ref 3.5–5.1)
Sodium: 138 mmol/L (ref 135–145)

## 2020-06-07 LAB — CBC
HCT: 34.8 % — ABNORMAL LOW (ref 36.0–46.0)
Hemoglobin: 10.5 g/dL — ABNORMAL LOW (ref 12.0–15.0)
MCH: 23 pg — ABNORMAL LOW (ref 26.0–34.0)
MCHC: 30.2 g/dL (ref 30.0–36.0)
MCV: 76.3 fL — ABNORMAL LOW (ref 80.0–100.0)
Platelets: 460 10*3/uL — ABNORMAL HIGH (ref 150–400)
RBC: 4.56 MIL/uL (ref 3.87–5.11)
RDW: 14.5 % (ref 11.5–15.5)
WBC: 6.7 10*3/uL (ref 4.0–10.5)
nRBC: 0 % (ref 0.0–0.2)

## 2020-06-13 ENCOUNTER — Other Ambulatory Visit: Payer: Self-pay | Admitting: Obstetrics and Gynecology

## 2020-06-13 NOTE — H&P (Signed)
[name] is a 49 y.o. female, P: 2-0-0-2, presents for hysterectomy because of menorrhagia, dysmenorrhea and adenomyosis.  For over 5 years the patient has endured heavy and painful periods.  She was given a trial of oral contraceptives in the past for management but the results were sub-optimal.  As time progressed her monthly periods became heavier lasting 8-10 days accompanied by clots and having the need to change a pad hourly.  Additionally she reports  severe cramping that radiates  to her back and is  rated 10/10. Fortunately her pain   will decrease to 5/10 with Ibuprofen 400 mg every 4-6 hours.  She denies any changes in bowel or bladder function, dyspareunia, vaginitis symptoms or other episodes of pelvic pain. A pelvic ultrasound performed 06/13/2020 showed an  anteverted uterus with appearance of adenomyosis (volume 535 mL) 15.17 x 7.33 x 9.20 cm, endometrium: 7.76 mm, right ovary-3.25 cm and left ovary-3.18 cm.  An endometrial biopsy done at that same time  returned proliferative endometrium-benign. A review of both medical and surgical management options were given to the patient however,  she wants to proceed with definitive therapy in the form of hysterectomy.   Past Medical History  OB History: G:2; P: 2-0-0-2; C-section: 2007 and 2009  GYN History: menarche: 49 YO;    LMP: 04/21/2020;    Contraception: Tubal Sterilization;  Denies history of abnormal PAP smear.   Last PAP smear: 2021-normal with negative HPV  Medical History: Post Partum Cardiomyopathy (twice)- normal EKG and ECHO 05/30/2020 and  Migraine  Surgical History: 2009 Tubal Sterilization   Family History: Hypercholesterolemia, Diabetes Mellitus and Colon Cancer  Social History:  Married and employed as a Pharmacist, hospital; Denies tobacco use and occasionally uses alcohol   Medications: Ibuprofen 200 mg every 6 hours prn Multivitamin daily Vitamin C Fish Oil  No Known Allergies  ROS:  Denies headache, vision changes,  nasal congestion, dysphagia, tinnitus, dizziness, hoarseness, cough,  chest pain, shortness of breath, nausea, vomiting, diarrhea,constipation,  urinary frequency, urgency  dysuria, hematuria, vaginitis symptoms, pelvic pain, swelling of joints,easy bruising,  myalgias, arthralgias, skin rashes, unexplained weight loss and except as is mentioned in the history of present illness, patient's review of systems is otherwise negative.     Physical Exam  Bp: 140/90; Weight: 152 lbs; Height: 4'11"; BMI: 30.2  Neck: supple without masses or thyromegaly Lungs: clear to auscultation Heart: regular rate and rhythm Abdomen: soft, non-tender and no organomegaly Pelvic:EGBUS- wnl; vagina-normal rugae; uterus-enlarged and bulky, cervix without lesions or motion tenderness; adnexae-no tenderness or masses Extremities:  no clubbing, cyanosis or edema   Assesment: Menorrhagia                      Dysmenorrhea                      Uterine Adenomyosis   Disposition:  Robot-Assisted Hysterectomy was reviewed with the patient.   Benefits of the robotic approach include lesser postoperative pain, less blood loss during surgery, reduced risk of injury to other organs due to better visualization with a 3-D HD 10 times magnifying camera, shorter hospital stay between 0-1 night and rapid recovery with return to daily routine in 2-3 weeks. Although robotically-assisted hysterectomy has a longer operative time than traditional laparotomy, in a patient with good medical history, the benefits usually outweigh the risks.   Risks include bleeding, infection, injury to other organs, need for laparotomy, transient post-operative facial edema, increased risk of  pelvic prolapse associated with any hysterectomy as well as earlier   onset of menopause. Preservation or preventative removal of the ovaries was also reviewed and left to the patient's discretion. Finally, the option of supracervical hysterectomy was also discussed  with the possible but yet unconfirmed benefits of reduction of pelvic prolapse. If supracervical hysterectomy is performed, Pap smear screening would continue as currently recommended, monthly bleeding is possible despite cauterization of cervical canal and a small but possible risk of cervical fibroid development is present.    The patient verbalized understanding of these risks and has consented to proceed with a Robot Assisted Laparoscopic Hysterectomy with Bilateral Salpingectomy at [facility] on June 20, 2020.          CSN# [id number]   Corretta Munce J. Florene Glen, PA-C  for Dr. Dede Query. Rivard

## 2020-06-14 ENCOUNTER — Other Ambulatory Visit (HOSPITAL_COMMUNITY): Payer: BC Managed Care – PPO

## 2020-06-18 ENCOUNTER — Other Ambulatory Visit (HOSPITAL_COMMUNITY): Payer: BC Managed Care – PPO

## 2020-06-18 ENCOUNTER — Other Ambulatory Visit (HOSPITAL_COMMUNITY)
Admission: RE | Admit: 2020-06-18 | Discharge: 2020-06-18 | Disposition: A | Discharge status: Home / Self Care | Payer: BC Managed Care – PPO | Source: Ambulatory Visit | Attending: Obstetrics and Gynecology | Admitting: Obstetrics and Gynecology

## 2020-06-18 DIAGNOSIS — Z20822 Contact with and (suspected) exposure to covid-19: Secondary | ICD-10-CM | POA: Insufficient documentation

## 2020-06-18 DIAGNOSIS — Z01812 Encounter for preprocedural laboratory examination: Secondary | ICD-10-CM | POA: Insufficient documentation

## 2020-06-18 LAB — SARS CORONAVIRUS 2 (TAT 6-24 HRS): SARS Coronavirus 2: NEGATIVE

## 2020-06-20 ENCOUNTER — Other Ambulatory Visit: Payer: Self-pay

## 2020-06-20 ENCOUNTER — Ambulatory Visit (HOSPITAL_BASED_OUTPATIENT_CLINIC_OR_DEPARTMENT_OTHER)
Admission: RE | Admit: 2020-06-20 | Discharge: 2020-06-21 | Disposition: A | Discharge status: Home / Self Care | Payer: BC Managed Care – PPO | Source: Other Acute Inpatient Hospital | Attending: Obstetrics and Gynecology | Admitting: Obstetrics and Gynecology

## 2020-06-20 ENCOUNTER — Ambulatory Visit (HOSPITAL_BASED_OUTPATIENT_CLINIC_OR_DEPARTMENT_OTHER): Payer: BC Managed Care – PPO | Admitting: Anesthesiology

## 2020-06-20 ENCOUNTER — Encounter (HOSPITAL_BASED_OUTPATIENT_CLINIC_OR_DEPARTMENT_OTHER)
Admission: RE | Disposition: A | Discharge status: Home / Self Care | Payer: Self-pay | Source: Other Acute Inpatient Hospital | Attending: Obstetrics and Gynecology

## 2020-06-20 ENCOUNTER — Encounter (HOSPITAL_BASED_OUTPATIENT_CLINIC_OR_DEPARTMENT_OTHER): Payer: Self-pay | Admitting: Obstetrics and Gynecology

## 2020-06-20 DIAGNOSIS — D259 Leiomyoma of uterus, unspecified: Secondary | ICD-10-CM | POA: Diagnosis not present

## 2020-06-20 DIAGNOSIS — N8003 Adenomyosis of the uterus: Secondary | ICD-10-CM

## 2020-06-20 DIAGNOSIS — N920 Excessive and frequent menstruation with regular cycle: Secondary | ICD-10-CM | POA: Insufficient documentation

## 2020-06-20 DIAGNOSIS — N800 Endometriosis of uterus: Secondary | ICD-10-CM | POA: Insufficient documentation

## 2020-06-20 DIAGNOSIS — N946 Dysmenorrhea, unspecified: Secondary | ICD-10-CM

## 2020-06-20 DIAGNOSIS — N921 Excessive and frequent menstruation with irregular cycle: Secondary | ICD-10-CM

## 2020-06-20 HISTORY — PX: LYSIS OF ADHESION: SHX5961

## 2020-06-20 HISTORY — PX: ROBOTIC ASSISTED LAPAROSCOPIC HYSTERECTOMY AND SALPINGECTOMY: SHX6379

## 2020-06-20 LAB — POCT PREGNANCY, URINE: Preg Test, Ur: NEGATIVE

## 2020-06-20 SURGERY — XI ROBOTIC ASSISTED LAPAROSCOPIC HYSTERECTOMY AND SALPINGECTOMY
Anesthesia: General | Site: Abdomen

## 2020-06-20 MED ORDER — OXYCODONE HCL 5 MG PO TABS
5.0000 mg | ORAL_TABLET | ORAL | Status: DC | PRN
  Administered 2020-06-21: 5 mg via ORAL

## 2020-06-20 MED ORDER — ACETAMINOPHEN 500 MG PO TABS
1000.0000 mg | ORAL_TABLET | ORAL | Status: AC
  Administered 2020-06-20: 1000 mg via ORAL

## 2020-06-20 MED ORDER — KETOROLAC TROMETHAMINE 30 MG/ML IJ SOLN
INTRAMUSCULAR | Status: AC
  Filled 2020-06-20: qty 1

## 2020-06-20 MED ORDER — SODIUM CHLORIDE 0.9 % IV SOLN
2.0000 g | INTRAVENOUS | Status: AC
  Administered 2020-06-20 (×2): 2 g via INTRAVENOUS

## 2020-06-20 MED ORDER — PROPOFOL 10 MG/ML IV BOLUS
INTRAVENOUS | Status: DC | PRN
  Administered 2020-06-20: 160 mg via INTRAVENOUS

## 2020-06-20 MED ORDER — SODIUM CHLORIDE 0.9 % IV SOLN
INTRAVENOUS | Status: AC
  Filled 2020-06-20: qty 2

## 2020-06-20 MED ORDER — LIDOCAINE 2% (20 MG/ML) 5 ML SYRINGE
INTRAMUSCULAR | Status: AC
  Filled 2020-06-20: qty 10

## 2020-06-20 MED ORDER — FENTANYL CITRATE (PF) 100 MCG/2ML IJ SOLN
INTRAMUSCULAR | Status: AC
  Filled 2020-06-20: qty 2

## 2020-06-20 MED ORDER — LIDOCAINE HCL (PF) 2 % IJ SOLN
INTRAMUSCULAR | Status: DC | PRN
Start: 2020-06-20 — End: 2020-06-20
  Administered 2020-06-20: 1 mg/kg/h via INTRADERMAL

## 2020-06-20 MED ORDER — OXYCODONE HCL 5 MG/5ML PO SOLN: 5.0000 mg | Freq: Once | ORAL | Status: DC | PRN

## 2020-06-20 MED ORDER — LIDOCAINE HCL (CARDIAC) PF 100 MG/5ML IV SOSY
PREFILLED_SYRINGE | INTRAVENOUS | Status: DC | PRN
  Administered 2020-06-20: 100 mg via INTRAVENOUS

## 2020-06-20 MED ORDER — KETOROLAC TROMETHAMINE 30 MG/ML IJ SOLN
30.0000 mg | Freq: Four times a day (QID) | INTRAMUSCULAR | Status: DC
  Administered 2020-06-21 (×2): 30 mg via INTRAVENOUS

## 2020-06-20 MED ORDER — LACTATED RINGERS IV SOLN: INTRAVENOUS | Status: DC

## 2020-06-20 MED ORDER — HYDROMORPHONE HCL 1 MG/ML IJ SOLN: 0.2500 mg | INTRAMUSCULAR | Status: DC | PRN

## 2020-06-20 MED ORDER — OXYCODONE HCL 5 MG PO TABS: ORAL_TABLET | ORAL | 0 refills | Status: AC

## 2020-06-20 MED ORDER — ACETAMINOPHEN 500 MG PO TABS
1000.0000 mg | ORAL_TABLET | Freq: Four times a day (QID) | ORAL | Status: DC
  Administered 2020-06-20 – 2020-06-21 (×2): 1000 mg via ORAL

## 2020-06-20 MED ORDER — CELECOXIB 200 MG PO CAPS
400.0000 mg | ORAL_CAPSULE | ORAL | Status: AC
  Administered 2020-06-20: 400 mg via ORAL

## 2020-06-20 MED ORDER — MENTHOL 3 MG MT LOZG: 1.0000 | LOZENGE | OROMUCOSAL | Status: DC | PRN

## 2020-06-20 MED ORDER — ONDANSETRON HCL 4 MG/2ML IJ SOLN: 4.0000 mg | Freq: Four times a day (QID) | INTRAMUSCULAR | Status: DC | PRN

## 2020-06-20 MED ORDER — ACETAMINOPHEN 500 MG PO TABS
ORAL_TABLET | ORAL | 1 refills | Status: AC
Start: 2020-06-20 — End: ?

## 2020-06-20 MED ORDER — ORAL CARE MOUTH RINSE: 15.0000 mL | Freq: Once | OROMUCOSAL | Status: DC

## 2020-06-20 MED ORDER — ROCURONIUM BROMIDE 10 MG/ML (PF) SYRINGE
PREFILLED_SYRINGE | INTRAVENOUS | Status: AC
  Filled 2020-06-20: qty 10

## 2020-06-20 MED ORDER — CHLORHEXIDINE GLUCONATE 0.12 % MT SOLN: 15.0000 mL | Freq: Once | OROMUCOSAL | Status: DC

## 2020-06-20 MED ORDER — ONDANSETRON HCL 4 MG/2ML IJ SOLN
INTRAMUSCULAR | Status: DC | PRN
  Administered 2020-06-20 (×2): 4 mg via INTRAVENOUS

## 2020-06-20 MED ORDER — SCOPOLAMINE 1 MG/3DAYS TD PT72
MEDICATED_PATCH | TRANSDERMAL | Status: AC
  Filled 2020-06-20: qty 1

## 2020-06-20 MED ORDER — ACETAMINOPHEN 500 MG PO TABS
ORAL_TABLET | ORAL | Status: AC
  Filled 2020-06-20: qty 2

## 2020-06-20 MED ORDER — SODIUM CHLORIDE 0.9 % IR SOLN
Status: DC | PRN
  Administered 2020-06-20: 3000 mL

## 2020-06-20 MED ORDER — GLYCOPYRROLATE PF 0.2 MG/ML IJ SOSY
PREFILLED_SYRINGE | INTRAMUSCULAR | Status: AC
  Filled 2020-06-20: qty 1

## 2020-06-20 MED ORDER — DOCUSATE SODIUM 100 MG PO CAPS
ORAL_CAPSULE | ORAL | Status: AC
  Filled 2020-06-20: qty 1

## 2020-06-20 MED ORDER — MIDAZOLAM HCL 5 MG/5ML IJ SOLN
INTRAMUSCULAR | Status: DC | PRN
  Administered 2020-06-20: 2 mg via INTRAVENOUS

## 2020-06-20 MED ORDER — CELECOXIB 200 MG PO CAPS
ORAL_CAPSULE | ORAL | Status: AC
  Filled 2020-06-20: qty 2

## 2020-06-20 MED ORDER — OXYCODONE HCL 5 MG PO TABS: 5.0000 mg | ORAL_TABLET | Freq: Once | ORAL | Status: DC | PRN

## 2020-06-20 MED ORDER — MIDAZOLAM HCL 2 MG/2ML IJ SOLN
INTRAMUSCULAR | Status: AC
  Filled 2020-06-20: qty 2

## 2020-06-20 MED ORDER — GLYCOPYRROLATE 0.2 MG/ML IJ SOLN
INTRAMUSCULAR | Status: DC | PRN
  Administered 2020-06-20: .2 mg via INTRAVENOUS

## 2020-06-20 MED ORDER — DEXAMETHASONE SODIUM PHOSPHATE 10 MG/ML IJ SOLN
INTRAMUSCULAR | Status: DC | PRN
  Administered 2020-06-20: 4 mg via INTRAVENOUS

## 2020-06-20 MED ORDER — SIMETHICONE 80 MG PO CHEW: 80.0000 mg | CHEWABLE_TABLET | Freq: Four times a day (QID) | ORAL | Status: DC | PRN

## 2020-06-20 MED ORDER — DEXAMETHASONE SODIUM PHOSPHATE 10 MG/ML IJ SOLN: 4.0000 mg | INTRAMUSCULAR | Status: DC

## 2020-06-20 MED ORDER — FENTANYL CITRATE (PF) 250 MCG/5ML IJ SOLN
INTRAMUSCULAR | Status: AC
  Filled 2020-06-20: qty 5

## 2020-06-20 MED ORDER — ONDANSETRON HCL 4 MG/2ML IJ SOLN
INTRAMUSCULAR | Status: AC
  Filled 2020-06-20: qty 2

## 2020-06-20 MED ORDER — LIDOCAINE 2% (20 MG/ML) 5 ML SYRINGE
INTRAMUSCULAR | Status: AC
  Filled 2020-06-20: qty 5

## 2020-06-20 MED ORDER — DOCUSATE SODIUM 100 MG PO CAPS
100.0000 mg | ORAL_CAPSULE | Freq: Two times a day (BID) | ORAL | Status: DC
  Administered 2020-06-20: 100 mg via ORAL

## 2020-06-20 MED ORDER — GABAPENTIN 300 MG PO CAPS
300.0000 mg | ORAL_CAPSULE | ORAL | Status: AC
  Administered 2020-06-20: 300 mg via ORAL

## 2020-06-20 MED ORDER — KETAMINE HCL 10 MG/ML IJ SOLN
INTRAMUSCULAR | Status: DC | PRN
  Administered 2020-06-20: 30 mg via INTRAVENOUS

## 2020-06-20 MED ORDER — ENSURE PRE-SURGERY PO LIQD: 296.0000 mL | Freq: Once | ORAL | Status: DC

## 2020-06-20 MED ORDER — KETAMINE HCL 10 MG/ML IJ SOLN
INTRAMUSCULAR | Status: AC
  Filled 2020-06-20: qty 1

## 2020-06-20 MED ORDER — DEXAMETHASONE SODIUM PHOSPHATE 10 MG/ML IJ SOLN
INTRAMUSCULAR | Status: AC
  Filled 2020-06-20: qty 1

## 2020-06-20 MED ORDER — SODIUM CHLORIDE 0.9 % IV SOLN
INTRAVENOUS | Status: DC | PRN
  Administered 2020-06-20: 110 mL
  Administered 2020-06-20: 10 mL

## 2020-06-20 MED ORDER — FENTANYL CITRATE (PF) 100 MCG/2ML IJ SOLN
INTRAMUSCULAR | Status: DC | PRN
  Administered 2020-06-20: 50 ug via INTRAVENOUS
  Administered 2020-06-20: 100 ug via INTRAVENOUS
  Administered 2020-06-20 (×3): 50 ug via INTRAVENOUS
  Administered 2020-06-20: 100 ug via INTRAVENOUS
  Administered 2020-06-20 (×3): 50 ug via INTRAVENOUS

## 2020-06-20 MED ORDER — SCOPOLAMINE 1 MG/3DAYS TD PT72
1.0000 | MEDICATED_PATCH | TRANSDERMAL | Status: DC
  Administered 2020-06-20: 1.5 mg via TRANSDERMAL

## 2020-06-20 MED ORDER — ROCURONIUM BROMIDE 100 MG/10ML IV SOLN
INTRAVENOUS | Status: DC | PRN
  Administered 2020-06-20: 60 mg via INTRAVENOUS
  Administered 2020-06-20 (×2): 20 mg via INTRAVENOUS
  Administered 2020-06-20: 30 mg via INTRAVENOUS

## 2020-06-20 MED ORDER — SODIUM CHLORIDE 0.9 % IR SOLN
Status: DC | PRN
  Administered 2020-06-20: 1000 mL

## 2020-06-20 MED ORDER — PROPOFOL 10 MG/ML IV BOLUS
INTRAVENOUS | Status: AC
  Filled 2020-06-20: qty 20

## 2020-06-20 MED ORDER — GABAPENTIN 300 MG PO CAPS
ORAL_CAPSULE | ORAL | Status: AC
  Filled 2020-06-20: qty 1

## 2020-06-20 MED ORDER — IBUPROFEN 600 MG PO TABS
ORAL_TABLET | ORAL | 1 refills | Status: AC
Start: 2020-06-20 — End: ?

## 2020-06-20 MED ORDER — ONDANSETRON HCL 4 MG PO TABS: 4.0000 mg | ORAL_TABLET | Freq: Four times a day (QID) | ORAL | Status: DC | PRN

## 2020-06-20 MED ORDER — KETOROLAC TROMETHAMINE 30 MG/ML IJ SOLN
INTRAMUSCULAR | Status: DC | PRN
Start: 2020-06-20 — End: 2020-06-20
  Administered 2020-06-20: 30 mg via INTRAVENOUS

## 2020-06-20 MED ORDER — SUGAMMADEX SODIUM 200 MG/2ML IV SOLN
INTRAVENOUS | Status: DC | PRN
  Administered 2020-06-20: 200 mg via INTRAVENOUS

## 2020-06-20 MED ORDER — KETOROLAC TROMETHAMINE 30 MG/ML IJ SOLN: 30.0000 mg | Freq: Once | INTRAMUSCULAR | Status: DC | PRN

## 2020-06-20 SURGICAL SUPPLY — 66 items
BARRIER ADHS 3X4 INTERCEED (GAUZE/BANDAGES/DRESSINGS) ×4 IMPLANT
CATH FOLEY 3WAY  5CC 16FR (CATHETERS) ×4
CATH FOLEY 3WAY 5CC 16FR (CATHETERS) ×2 IMPLANT
CLOSURE WOUND 1/4X4 (GAUZE/BANDAGES/DRESSINGS) ×1
COVER BACK TABLE 60X90IN (DRAPES) ×4 IMPLANT
COVER TIP SHEARS 8 DVNC (MISCELLANEOUS) ×2 IMPLANT
COVER TIP SHEARS 8MM DA VINCI (MISCELLANEOUS) ×4
COVER WAND RF STERILE (DRAPES) ×4 IMPLANT
DECANTER SPIKE VIAL GLASS SM (MISCELLANEOUS) ×8 IMPLANT
DEFOGGER SCOPE WARMER CLEARIFY (MISCELLANEOUS) ×4 IMPLANT
DERMABOND ADVANCED (GAUZE/BANDAGES/DRESSINGS) ×2
DERMABOND ADVANCED .7 DNX12 (GAUZE/BANDAGES/DRESSINGS) ×2 IMPLANT
DISSECTOR BLUNT TIP ENDO 5MM (MISCELLANEOUS) ×4 IMPLANT
DISSECTOR ROUND CHERRY 3/8 STR (MISCELLANEOUS) ×4 IMPLANT
DRAPE ARM DVNC X/XI (DISPOSABLE) ×8 IMPLANT
DRAPE COLUMN DVNC XI (DISPOSABLE) ×2 IMPLANT
DRAPE DA VINCI XI ARM (DISPOSABLE) ×16
DRAPE DA VINCI XI COLUMN (DISPOSABLE) ×4
DRAPE UTILITY XL STRL (DRAPES) ×4 IMPLANT
DRSG TEGADERM 4X4.75 (GAUZE/BANDAGES/DRESSINGS) ×4 IMPLANT
DRSG TEGADERM 8X12 (GAUZE/BANDAGES/DRESSINGS) ×4 IMPLANT
DURAPREP 26ML APPLICATOR (WOUND CARE) ×4 IMPLANT
ELECT REM PT RETURN 9FT ADLT (ELECTROSURGICAL) ×4
ELECTRODE REM PT RTRN 9FT ADLT (ELECTROSURGICAL) ×2 IMPLANT
GAUZE 4X4 16PLY RFD (DISPOSABLE) ×4 IMPLANT
GLOVE BIO SURGEON STRL SZ7 (GLOVE) ×8 IMPLANT
GLOVE BIOGEL PI IND STRL 7.0 (GLOVE) ×12 IMPLANT
GLOVE BIOGEL PI IND STRL 7.5 (GLOVE) ×4 IMPLANT
GLOVE BIOGEL PI INDICATOR 7.0 (GLOVE) ×12
GLOVE BIOGEL PI INDICATOR 7.5 (GLOVE) ×4
GLOVE ECLIPSE 6.5 STRL STRAW (GLOVE) ×12 IMPLANT
GLOVE ECLIPSE 7.5 STRL STRAW (GLOVE) ×8 IMPLANT
IRRIG SUCT STRYKERFLOW 2 WTIP (MISCELLANEOUS) ×4
IRRIGATION SUCT STRKRFLW 2 WTP (MISCELLANEOUS) ×2 IMPLANT
LEGGING LITHOTOMY PAIR STRL (DRAPES) ×4 IMPLANT
MANIFOLD NEPTUNE II (INSTRUMENTS) ×4 IMPLANT
MANIPULATOR ADVINCU DEL 2.5 PL (MISCELLANEOUS) ×8 IMPLANT
NEEDLE HYPO 22GX1.5 SAFETY (NEEDLE) ×4 IMPLANT
OBTURATOR OPTICAL STANDARD 8MM (TROCAR) ×4
OBTURATOR OPTICAL STND 8 DVNC (TROCAR) ×2
OBTURATOR OPTICALSTD 8 DVNC (TROCAR) ×2 IMPLANT
OCCLUDER COLPOPNEUMO (BALLOONS) ×4 IMPLANT
PACK ROBOT WH (CUSTOM PROCEDURE TRAY) ×4 IMPLANT
PACK ROBOTIC GOWN (GOWN DISPOSABLE) ×4 IMPLANT
PACK TRENDGUARD 450 HYBRID PRO (MISCELLANEOUS) ×2 IMPLANT
PAD PREP 24X48 CUFFED NSTRL (MISCELLANEOUS) ×4 IMPLANT
POUCH LAPAROSCOPIC INSTRUMENT (MISCELLANEOUS) ×4 IMPLANT
PROTECTOR NERVE ULNAR (MISCELLANEOUS) ×12 IMPLANT
SEAL CANN UNIV 5-8 DVNC XI (MISCELLANEOUS) ×8 IMPLANT
SEAL XI 5MM-8MM UNIVERSAL (MISCELLANEOUS) ×16
SEALER VESSEL DA VINCI XI (MISCELLANEOUS) ×4
SEALER VESSEL EXT DVNC XI (MISCELLANEOUS) ×2 IMPLANT
SET IRRIG Y TYPE TUR BLADDER L (SET/KITS/TRAYS/PACK) ×4 IMPLANT
SET TRI-LUMEN FLTR TB AIRSEAL (TUBING) ×4 IMPLANT
STRIP CLOSURE SKIN 1/4X4 (GAUZE/BANDAGES/DRESSINGS) ×3 IMPLANT
SUT MNCRL AB 3-0 PS2 27 (SUTURE) ×8 IMPLANT
SUT VIC AB 0 CT1 27 (SUTURE) ×8
SUT VIC AB 0 CT1 27XBRD ANBCTR (SUTURE) ×4 IMPLANT
SUT VICRYL 0 UR6 27IN ABS (SUTURE) ×4 IMPLANT
SUT VLOC 180 0 9IN  GS21 (SUTURE) ×8
SUT VLOC 180 0 9IN GS21 (SUTURE) ×4 IMPLANT
TIP UTERINE 6.7X8CM BLUE DISP (MISCELLANEOUS) ×4 IMPLANT
TOWEL OR 17X26 10 PK STRL BLUE (TOWEL DISPOSABLE) ×4 IMPLANT
TRENDGUARD 450 HYBRID PRO PACK (MISCELLANEOUS) ×4
TROCAR PORT AIRSEAL 8X120 (TROCAR) ×4 IMPLANT
WATER STERILE IRR 1000ML POUR (IV SOLUTION) ×4 IMPLANT

## 2020-06-20 NOTE — Anesthesia Preprocedure Evaluation (Signed)
Anesthesia Evaluation  Patient identified by MRN, date of birth, ID band Patient awake    Reviewed: Allergy & Precautions, NPO status , Patient's Chart, lab work & pertinent test results  Airway Mallampati: II  TM Distance: >3 FB Neck ROM: Full    Dental no notable dental hx.    Pulmonary neg pulmonary ROS,    Pulmonary exam normal breath sounds clear to auscultation       Cardiovascular Normal cardiovascular exam+ Valvular Problems/Murmurs MR  Rhythm:Regular Rate:Normal     Neuro/Psych negative neurological ROS  negative psych ROS   GI/Hepatic negative GI ROS, Neg liver ROS,   Endo/Other  negative endocrine ROS  Renal/GU negative Renal ROS  negative genitourinary   Musculoskeletal negative musculoskeletal ROS (+)   Abdominal   Peds negative pediatric ROS (+)  Hematology  (+) anemia ,   Anesthesia Other Findings   Reproductive/Obstetrics negative OB ROS                             Anesthesia Physical Anesthesia Plan  ASA: II  Anesthesia Plan: General   Post-op Pain Management:    Induction: Intravenous  PONV Risk Score and Plan: 3 and Ondansetron, Dexamethasone and Treatment may vary due to age or medical condition  Airway Management Planned: Oral ETT  Additional Equipment:   Intra-op Plan:   Post-operative Plan: Extubation in OR  Informed Consent: I have reviewed the patients History and Physical, chart, labs and discussed the procedure including the risks, benefits and alternatives for the proposed anesthesia with the patient or authorized representative who has indicated his/her understanding and acceptance.     Dental advisory given  Plan Discussed with: CRNA and Surgeon  Anesthesia Plan Comments:         Anesthesia Quick Evaluation

## 2020-06-20 NOTE — Transfer of Care (Signed)
Immediate Anesthesia Transfer of Care Note  Patient: [name]  Procedure(s) Performed: Procedure(s) (LRB): XI ROBOTIC ASSISTED LAPAROSCOPIC HYSTERECTOMY AND SALPINGECTOMY (Bilateral) EXTENSIVE LYSIS OF ADHESIONS (N/A)  Patient Location: PACU  Anesthesia Type: General  Level of Consciousness: awake, sedated, patient cooperative and responds to stimulation  Airway & Oxygen Therapy: Patient Spontanous Breathing and Patient connected to [place] 02 and soft FM   Post-op Assessment: Report given to PACU RN, Post -op Vital signs reviewed and stable and Patient moving all extremities  Post vital signs: Reviewed and stable  Complications: No apparent anesthesia complications

## 2020-06-20 NOTE — Interval H&P Note (Signed)
History and Physical Interval Note:  06/20/2020 12:35 PM  [name]  has presented today for surgery, with the diagnosis of Menorrhagia and Dysmenorrhea.  The various methods of treatment have been discussed with the patient and family. After consideration of risks, benefits and other options for treatment, the patient has consented to  Procedure(s): XI ROBOTIC ASSISTED LAPAROSCOPIC HYSTERECTOMY AND SALPINGECTOMY (Bilateral) as a surgical intervention.  The patient's history has been reviewed, patient examined, no change in status, stable for surgery.  I have reviewed the patient's chart and labs.  Questions were answered to the patient's satisfaction.     Katharine Look A Daoud Lobue

## 2020-06-20 NOTE — Anesthesia Postprocedure Evaluation (Addendum)
Anesthesia Post Note  Patient: [name]  Procedure(s) Performed: XI ROBOTIC ASSISTED LAPAROSCOPIC HYSTERECTOMY AND SALPINGECTOMY (Bilateral Abdomen) EXTENSIVE LYSIS OF ADHESIONS (N/A Abdomen)     Patient location during evaluation: PACU Anesthesia Type: General Level of consciousness: awake and alert Pain management: pain level controlled Vital Signs Assessment: post-procedure vital signs reviewed and stable Respiratory status: spontaneous breathing, nonlabored ventilation, respiratory function stable and patient connected to nasal cannula oxygen Cardiovascular status: blood pressure returned to baseline and stable Postop Assessment: no apparent nausea or vomiting Anesthetic complications: no Comments: Pt has fullness in back of throat but no airway compromise, likely secondary to the ETT placement/position, length of surgery and trendelenburg position. Pt reassured it will improve with time. IV steroids administered intraoperatively.    No complications documented.  Last Vitals:  Vitals:   06/20/20 1949 06/20/20 2013  BP: (!) 108/53 (!) 112/57  Pulse: 89 82  Resp: 16 12  Temp: 37.7 C 37.2 C  SpO2: 96% 98%    Last Pain:  Vitals:   06/20/20 2013  TempSrc:   PainSc: Asleep                 Effie Berkshire

## 2020-06-20 NOTE — Progress Notes (Signed)
Pt complains of fullness and soreness to back of throat, attempts to view back of throat, small areas of ulceration noted to right back oral pharynx. Dr. Smith Robert called and made aware of situation. States this will take time and she can perform salt water gargles.

## 2020-06-20 NOTE — Anesthesia Procedure Notes (Signed)
Procedure Name: Intubation Date/Time: 06/20/2020 1:16 PM Performed by: Garrel Ridgel, CRNA Pre-anesthesia Checklist: Patient identified, Emergency Drugs available, Suction available and Patient being monitored Patient Re-evaluated:Patient Re-evaluated prior to induction Oxygen Delivery Method: Circle system utilized Preoxygenation: Pre-oxygenation with 100% oxygen Induction Type: IV induction Ventilation: Mask ventilation without difficulty Laryngoscope Size: Mac and 3 Grade View: Grade III Tube type: Oral Tube size: 7.0 mm Number of attempts: 1 Airway Equipment and Method: Stylet,  Oral airway and Bougie stylet Placement Confirmation: ETT inserted through vocal cords under direct vision,  positive ETCO2 and breath sounds checked- equal and bilateral Secured at: 22 cm Tube secured with: Tape Dental Injury: Teeth and Oropharynx as per pre-operative assessment  Difficulty Due To: Difficult Airway- due to anterior larynx

## 2020-06-20 NOTE — Op Note (Signed)
Preoperative diagnosis: Menorrhagia with dysmenorrhea  Postoperative diagnosis: Same with extensive pelvic adhesions and  suspected adenomyosis  Anesthesia: General  Anesthesiologist: Dr. Kalman Shan  Procedure: Robotically assisted lysis of adhesions with  total hysterectomy with bilateral salpingectomy  Surgeon: Dr. Katharine Look Casia Corti  Assistant: Earnstine Regal P.A.-C.  Estimated blood loss: 100 cc  Procedure:  After being informed of the planned procedure with possible complications including but not limited to bleeding, infection, injury to other organs, need for laparotomy, possible need for morcellation with risks and benefits reviewed, expected hospital stay and recovery, informed consent is obtained and patient is taken to or #6. She is placed in  lithotomy position on [place] with both arms padded and tucked on each side and bilateral knee-high sequential compressive devices. She is given general anesthesia with endotracheal intubation without any complication. She is prepped and draped in a sterile fashion. A three-way Foley catheter is inserted in her bladder.  Pelvic exam reveals: 18 week size uterus with reduced mobility  A weighted speculum is inserted in the vagina and the anterior lip of the cervix is grasped with a tenaculum forcep. We proceed with a paracervical block and vaginal infiltration using ropivacaine 0.5% diluted 1 in 1 with saline. The uterus was then sounded at 10 cm. We easily dilate the cervix using Hegar dilator to  #27 which allows for easy placement of the intrauterine Advincula manipulator with a 2.5 KOH ring and a vaginal occluder.   Trocar placement is decided. We infiltrate 8 cm above the umbilicus with 10 cc of ropivacaine per protocol and perform a 10 mm vertical incision which is brought down bluntly to the fascia. The fascia is identified and grasped with Coker forceps. The fascia is incised with Mayo scissors. Peritoneum is entered bluntly. A pursestring suture  of 0 Vicryl is placed on the fascia and a 10 mm Hassan trocar is easily inserted in the abdominal cavity held in placed with a Purstring suture. This allows for easy insufflation of a pneumoperitoneum using warmed CO2 at a maximum pressure of 15 mm of mercury. 60 cc of Ropivacaine 0.5 % diluted 1 in 1 is sent in the pelvis and the patient is positioned in reverse Trendelenburg. We then placed two 50mm robotic trocar on the left, one 74mm robotic trocar on the right and one 8 mm patient's side assistant trocar on the right  after infiltrating every site  with ropivacaine per protocol. The robot is docked on the right of the patient after positioning her in Trendelenburg. A Vessel Seal is inserted in arm #4, a Long bipolar forceps is inserted in arm #2 and a Prograsp is inserted in arm #1.  Preparation and docking is completed in 63 minutes.  Observation: We note adhesions between the omentum and the abdominal wall at the level of the umbilicus. The uterus is enlarged and globulus. Both tubes and ovaries are normal. The posterior cul-de-sac is obliterated by dense adhesions between the mid body of the uterus and colon. We proceed with sharp dissection of the abdominal wall adhesions. We then free the posterior cul-de-sac with sharp and blunt dissection of the adhesions with the bowel.  We start on the right side by sealing and cutting the mesosalpynx , the right utero-ovarian ligament and the right round ligament .  This gives [place] entry into the retroperitoneal space. We are unable to identify the anterior cul-de-sac due to dense adhesions with the bladder up to the uterine mid body. We proceed with a posterior approach to  isolate the uterine vessels.  Moving to the left side we Seal and cut  the left round ligament , the left utero-ovarian ligament and  the mesosalpinx in between. Again our only approach is the posterior retroperitoneal space to isolate the uterine vessels. Both ureters are easily identified on  their full course.   We then proceed with systematic dissection of the bladder from the anterior vaginal cuff. The plane of dissection is identified with filling the bladder with 400 cc of saline. We proceed systematically with sharp and blunt dissection. We are able to dissect the bladder 2 cm below the KOH ring.   Both tubes are removed from the pelvic cavity through the assistant's trocar.   With pressure on the KOH ring and the bladder fully dissected and emptied , we cauterize and cut the uterine vessels on both sides at the level of the KOH ring.  The vaginal occluder is inflated and we proceed with a 360 colpotomy using an open monopolar scissors and freeing the uterus entirely.  The uterus is delivered vaginally with sharp morcellation. The vaginal occluder is reinserted in the vagina to maintain pneumoperitoneum.  Instruments are then modified for a suture cut in arm #4 and a long tip forcep in arm #2. We proceed with closure of the vaginal cuff with 2 running sutures of 0 V-Lock. We irrigated profusely with warm saline and confirm a satisfactory hemostasis as well as 2 normal ureters with good mobility and no dilatation.  A sheet of Interceed , divided in 2, is placed on the vaginal cuff and behind the ovaries.  All instruments are then removed and the robot is undocked. Console time: 1 hours and 56 minutes.  All trochars are removed under direct visualization after evacuating the pneumoperitoneum.  The fascia of the supraumbilical incision is closed with the previously placed pursestring suture of 0 Vicryl. The fascia of the assistant trocar is closed with a 0 Vicryl suture. All incisions are then closed with subcuticular suture of 3-0 Monocryl and Dermabond.  A speculum is inserted in the vagina. Two figure-of-eight sutures are placed  to adequate closure of the vaginal cuff and good hemostasis.  Instrument and sponge count is complete x2. The procedure is well tolerated by the  patient is taken to recovery room in a well and stable condition.  Estimated blood loss: 100 cc  Specimen: Uterus and tubes weighing 340 g sent to pathology

## 2020-06-21 ENCOUNTER — Encounter (HOSPITAL_BASED_OUTPATIENT_CLINIC_OR_DEPARTMENT_OTHER): Payer: Self-pay | Admitting: Obstetrics and Gynecology

## 2020-06-21 DIAGNOSIS — N800 Endometriosis of uterus: Secondary | ICD-10-CM | POA: Diagnosis not present

## 2020-06-21 MED ORDER — KETOROLAC TROMETHAMINE 30 MG/ML IJ SOLN
INTRAMUSCULAR | Status: AC
  Filled 2020-06-21: qty 1

## 2020-06-21 MED ORDER — ACETAMINOPHEN 500 MG PO TABS
ORAL_TABLET | ORAL | Status: AC
  Filled 2020-06-21: qty 2

## 2020-06-21 MED ORDER — OXYCODONE HCL 5 MG PO TABS
ORAL_TABLET | ORAL | Status: AC
  Filled 2020-06-21: qty 1

## 2020-06-21 NOTE — Discharge Instructions (Signed)
Laparoscopically Assisted Vaginal Hysterectomy, Care After This sheet gives you information about how to care for yourself after your procedure. Your health care provider may also give you more specific instructions. If you have problems or questions, contact your health care provider. What can I expect after the procedure? After the procedure, it is common to have:  Soreness and numbness in your incision areas.  Abdominal pain. You will be given pain medicine to control it.  Vaginal bleeding and discharge. You will need to use a sanitary napkin after this procedure.  Sore throat from the breathing tube that was inserted during surgery. Follow these instructions at home: Medicines  Take over-the-counter and prescription medicines only as told by your health care provider.  Do not take aspirin or ibuprofen. These medicines can cause bleeding.  Do not drive or use heavy machinery while taking prescription pain medicine.  Do not drive for 24 hours if you were given a medicine to help you relax (sedative) during the procedure. Incision care   Follow instructions from your health care provider about how to take care of your incisions. Make sure you: ? Wash your hands with soap and water before you change your bandage (dressing). If soap and water are not available, use hand sanitizer. ? Change your dressing as told by your health care provider. ? Leave stitches (sutures), skin glue, or adhesive strips in place. These skin closures may need to stay in place for 2 weeks or longer. If adhesive strip edges start to loosen and curl up, you may trim the loose edges. Do not remove adhesive strips completely unless your health care provider tells you to do that.  Check your incision area every day for signs of infection. Check for: ? Redness, swelling, or pain. ? Fluid or blood. ? Warmth. ? Pus or a bad smell. Activity  Get regular exercise as told by your health care provider. You may be  told to take short walks every day and go farther each time.  Return to your normal activities as told by your health care provider. Ask your health care provider what activities are safe for you.  Do not douche, use tampons, or have sexual intercourse for at least 6 weeks, or until your health care provider gives you permission.  Do not lift anything that is heavier than 10 lb (4.5 kg), or the limit that your health care provider tells you, until he or she says that it is safe. General instructions  Do not take baths, swim, or use a hot tub until your health care provider approves. Take showers instead of baths.  Do not drive for 24 hours if you received a sedative.  Do not drive or operate heavy machinery while taking prescription pain medicine.  To prevent or treat constipation while you are taking prescription pain medicine, your health care provider may recommend that you: ? Drink enough fluid to keep your urine clear or pale yellow. ? Take over-the-counter or prescription medicines. ? Eat foods that are high in fiber, such as fresh fruits and vegetables, whole grains, and beans. ? Limit foods that are high in fat and processed sugars, such as fried and sweet foods.  Keep all follow-up visits as told by your health care provider. This is important. Contact a health care provider if:  You have signs of infection, such as: ? Redness, swelling, or pain around your incision sites. ? Fluid or blood coming from an incision. ? An incision that feels warm to the   touch. ? Pus or a bad smell coming from an incision.  Your incision breaks open.  Your pain medicine is not helping.  You feel dizzy or light-headed.  You have pain or bleeding when you urinate.  You have persistent nausea and vomiting.  You have blood, pus, or a bad-smelling discharge from your vagina. Get help right away if:  You have a fever.  You have severe abdominal pain.  You have chest pain.  You have  shortness of breath.  You faint.  You have pain, swelling, or redness in your leg.  You have heavy bleeding from your vagina. Summary  After the procedure, it is common to have abdominal pain and vaginal bleeding.  You should not drive or lift heavy objects until your health care provider says that it is safe.  Contact your health care provider if you have any symptoms of infection, excessive vaginal bleeding, nausea, vomiting, or shortness of breath. This information is not intended to replace advice given to you by your health care provider. Make sure you discuss any questions you have with your health care provider. Document Revised: 11/12/2017 Document Reviewed: 01/26/2017 Elsevier Patient Education  2020 [employer]. Call [facility] @ [phone] if:  You have a temperature greater than or equal to 100.4 degrees Farenheit orally You have pain that is not made better by the pain medication given and taken as directed You have excessive bleeding or problems urinating  Take Colace (Docusate Sodium/Stool Softener) 100 mg 2-3 times daily while taking narcotic pain medicine to avoid constipation or until bowel movements are regular. Take Ibuprofen 600 mg with food and Acetaminophen (#2) 500 mg every 6 hours for 5 days then as needed for pain  You may drive after 1 week You may walk up steps  You may shower tomorrow You may resume a regular diet  Keep incisions clean and dry Do not lift over 15 pounds for 6 weeks Avoid anything in vagina for 6 weeks

## 2020-06-21 NOTE — Progress Notes (Signed)
[name] is a34 y.o.  [id number]  Post Op Date # 1: Robot-Assisted Total Laparoscopic Hysterectomy, Bilateral Salpingectomy and Extensive Lysis of Adhesions  Subjective: Patient is Doing well postoperatively. Patient has Pain is controlled with current analgesics. Medications being used: acetaminophen and prescription NSAID's including Roxicodone as needed but has not been required.. Patient reported last evening a sore throat however says that it is much better this morning. Denies any further problems swallowing. Goes on to report some gas pain in upper right shoulder with some relief with heating pad over the area.  She has not breathing problems or chest pain.  Tolerating full liquids and plans to order a regular breakfast this morning.  Has voided without difficulty and ambulated in the halls.   Objective: Vital signs in last 24 hours: Temp:  [98.4 F (36.9 C)-99.8 F (37.7 C)] 98.4 F (36.9 C) (07/09 0522) Pulse Rate:  [72-98] 85 (07/09 0522) Resp:  [12-22] 22 (07/09 0522) BP: (97-170)/(53-94) 110/68 (07/09 0522) SpO2:  [95 %-100 %] 97 % (07/09 0522) Weight:  [67.7 kg] 67.7 kg (07/08 1101)  Intake/Output from previous day: 07/08 0701 - 07/09 0700 In: 2391.7 [P.O.:150; I.V.:2141.7] Out: 810 [Urine:700] Intake/Output this shift: No intake/output data recorded. No results for input(s): WBC, HGB, HCT, PLT in the last 168 hours.  No results for input(s): NA, K, CL, CO2, BUN, CREATININE, CALCIUM, PROT, BILITOT, ALKPHOS, ALT, AST, GLUCOSE in the last 168 hours.  Invalid input(s): LABALBU  EXAM: General: alert, cooperative and no distress Resp: clear to auscultation bilaterally and respiration rate on exam = 15. Cardio: regular rate and rhythm, S1, S2 normal, no murmur, click, rub or gallop GI: bowel sounds present and incisions are intact with no evidence of infection. Extremities: Homans sign is negative, no sign of DVT and no calf tenderness. Vaginal Bleeding:  none   Assessment: s/p Procedure(s): XI ROBOTIC ASSISTED LAPAROSCOPIC HYSTERECTOMY AND SALPINGECTOMY EXTENSIVE LYSIS OF ADHESIONS: stable, progressing well and tolerating diet  Plan: Discharge home  LOS: 0 days    Earnstine Regal, PA-C 06/21/2020 7:16 AM

## 2020-06-24 LAB — SURGICAL PATHOLOGY

## 2020-12-25 IMAGING — US SOFT TISSUE ULTRASOUND HEAD/NECK
1 series · 11 of 11 positions shown · non-contrast
Comparison: None.

CLINICAL DATA: Palpable right cervical adenopathy along the
submandibular region.

EXAM:
ULTRASOUND OF HEAD/NECK SOFT TISSUES
TECHNIQUE: Ultrasound examination of the head and neck soft tissues was
performed in the area of clinical concern.

[Series 1: soft tissue ultrasound head/neck · 0.07mm/px · 11 of 11 slices shown]
[im 1/11]
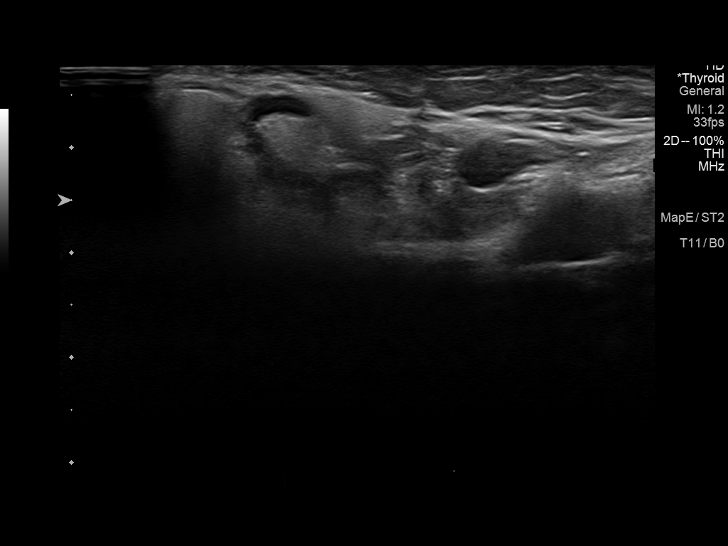
[im 2/11]
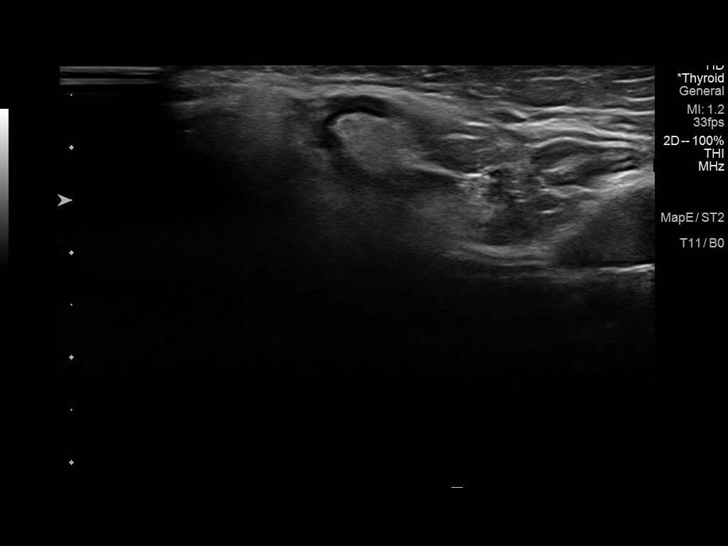
[im 3/11]
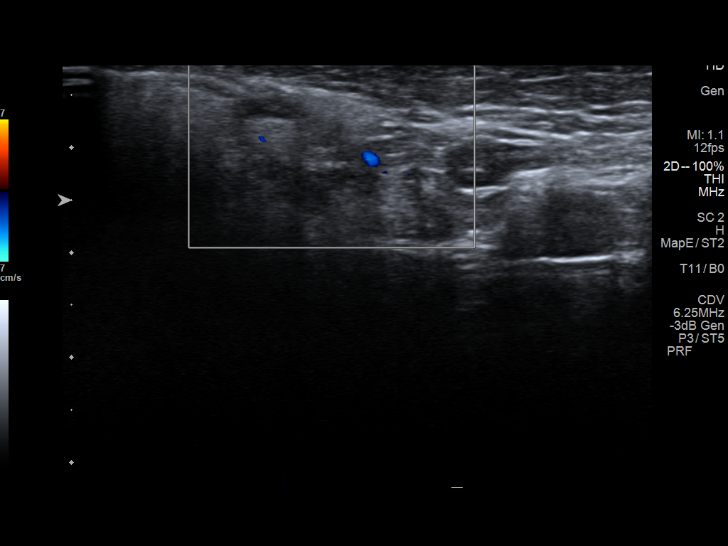
[im 4/11]
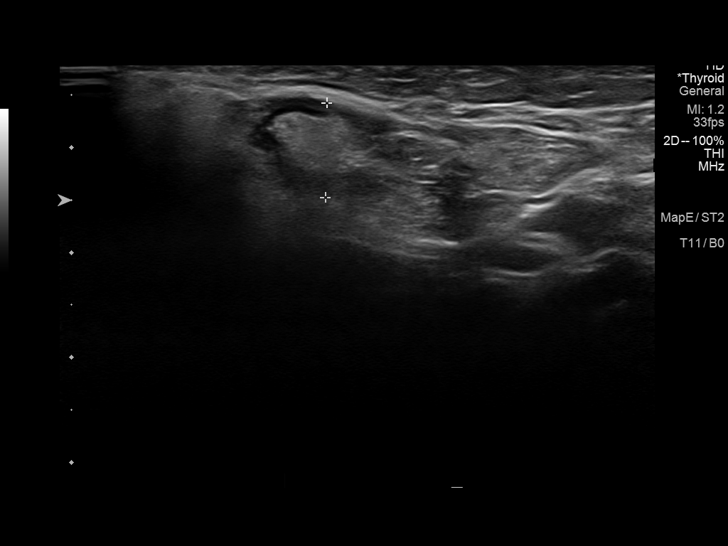
[im 5/11]
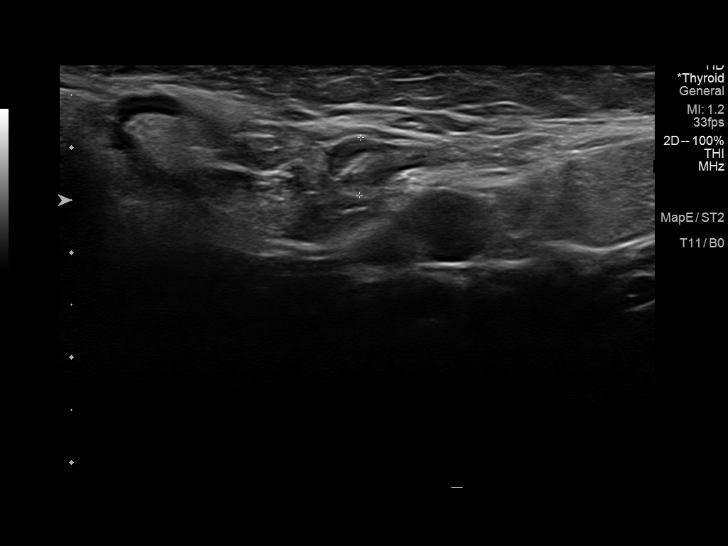
[im 6/11]
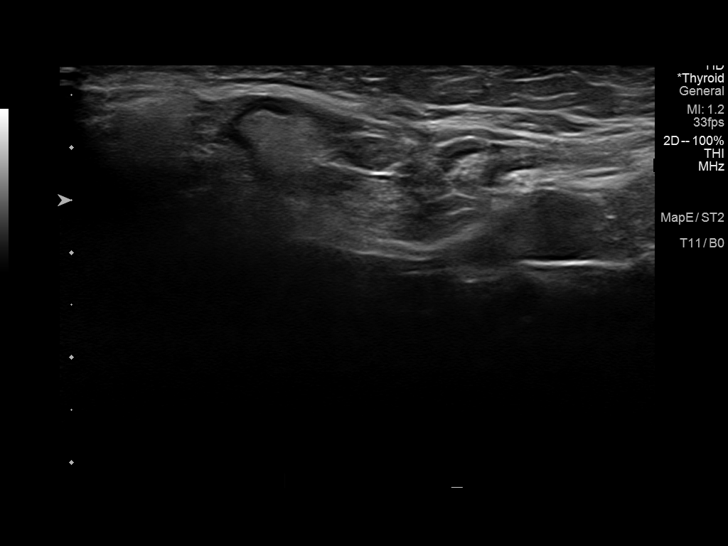
[im 7/11]
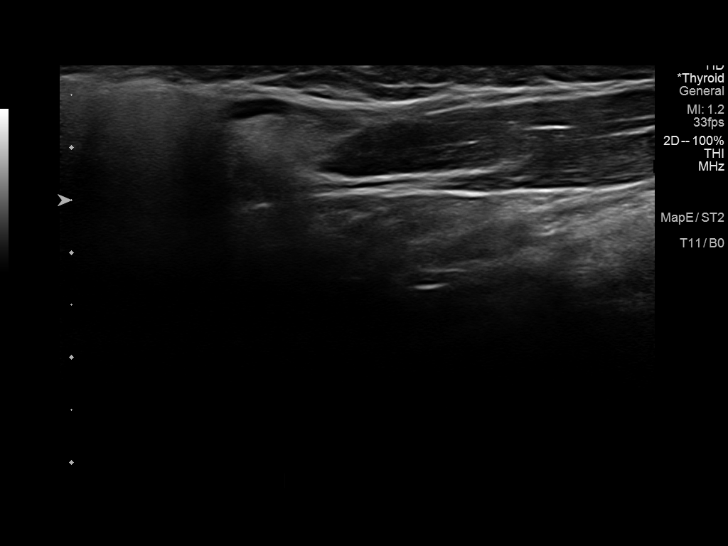
[im 8/11]
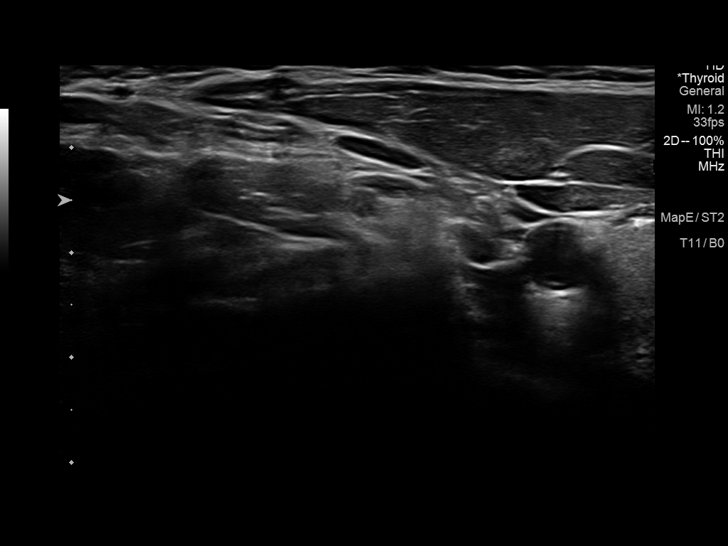
[im 9/11]
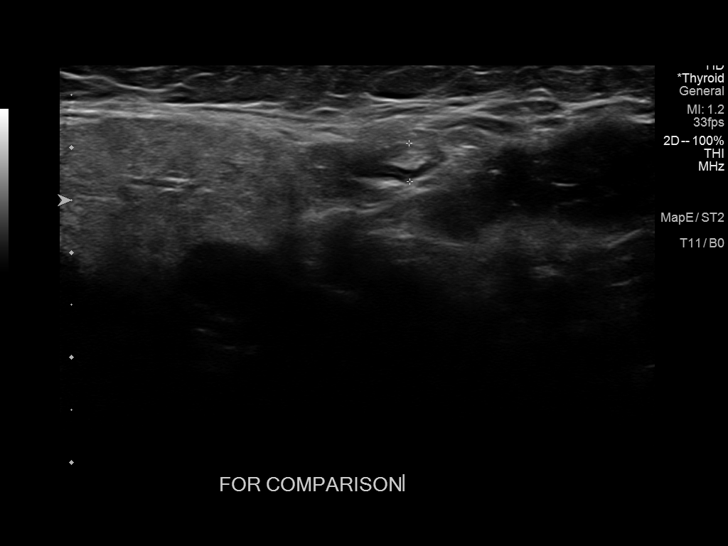
[im 10/11]
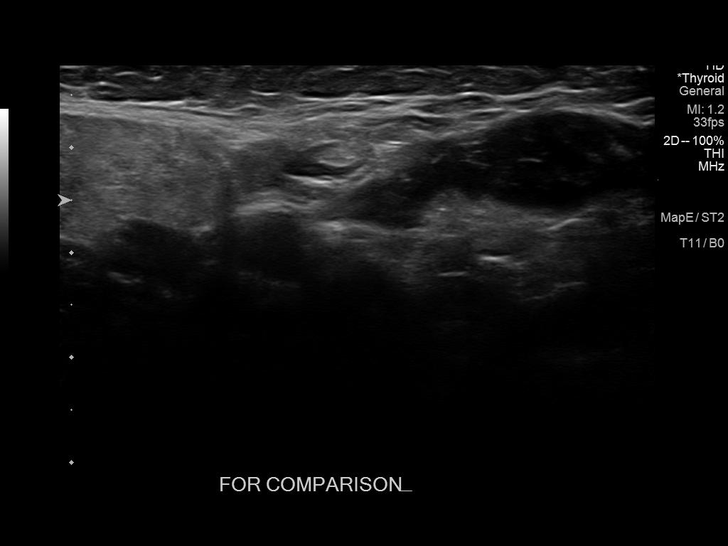
[im 11/11]
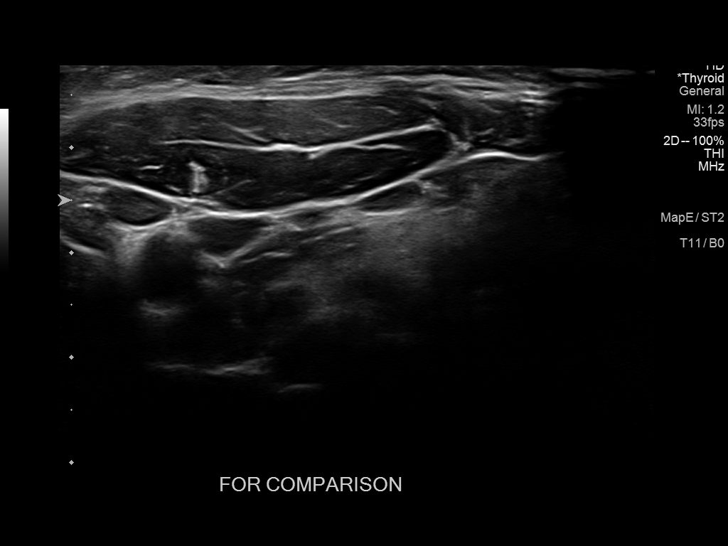

[11 of 11 positions shown; findings below may reference images not displayed]

FINDINGS: Soft tissue ultrasound performed of the right submandibular palpable
lymph nodes. Palpable abnormality correlates with benign-appearing
right cervical/submandibular superficial lymph nodes with hypoechoic
cortex and fatty hila. Short axis measurements are 9 mm or less. No
other significant abnormality by ultrasound including soft tissue
mass, cyst, fluid collection, hemorrhage, hematoma, or abscess.
IMPRESSION: Right submandibular palpable abnormality correlates with superficial
benign-appearing prominent lymph nodes.

## 2021-08-11 ENCOUNTER — Other Ambulatory Visit: Payer: Self-pay | Admitting: Gastroenterology

## 2021-08-11 DIAGNOSIS — R7989 Other specified abnormal findings of blood chemistry: Secondary | ICD-10-CM

## 2021-08-20 ENCOUNTER — Ambulatory Visit
Admission: RE | Admit: 2021-08-20 | Discharge: 2021-08-20 | Disposition: A | Discharge status: Home / Self Care | Payer: BC Managed Care – PPO | Source: Ambulatory Visit | Attending: Gastroenterology | Admitting: Gastroenterology

## 2021-08-20 ENCOUNTER — Other Ambulatory Visit: Payer: Self-pay

## 2021-08-20 DIAGNOSIS — R7989 Other specified abnormal findings of blood chemistry: Secondary | ICD-10-CM

## 2023-03-22 IMAGING — US US ABDOMEN LIMITED
1 series · 14 of 25 positions shown · non-contrast
Comparison: None.

CLINICAL DATA: Elevated LFTs.

EXAM:
ULTRASOUND ABDOMEN LIMITED RIGHT UPPER QUADRANT

[Series 1: us abdomen limited · 0.26mm/px · 41 acquisitions, 14 frames shown]
[im 1/41]
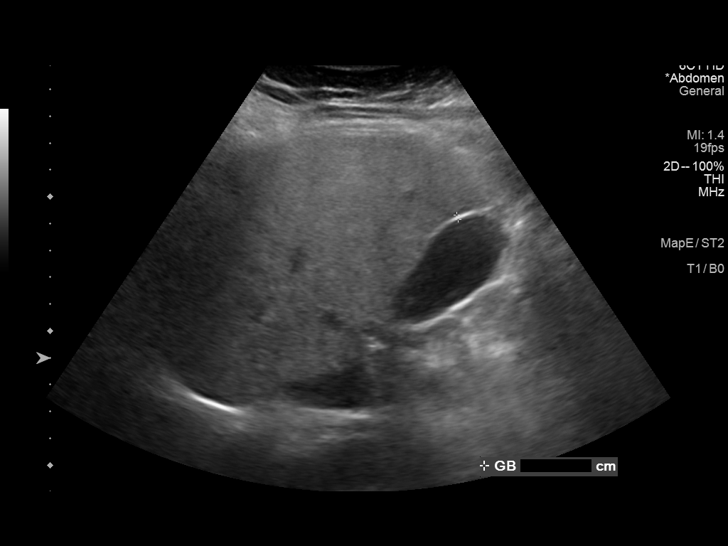
[im 4/41]
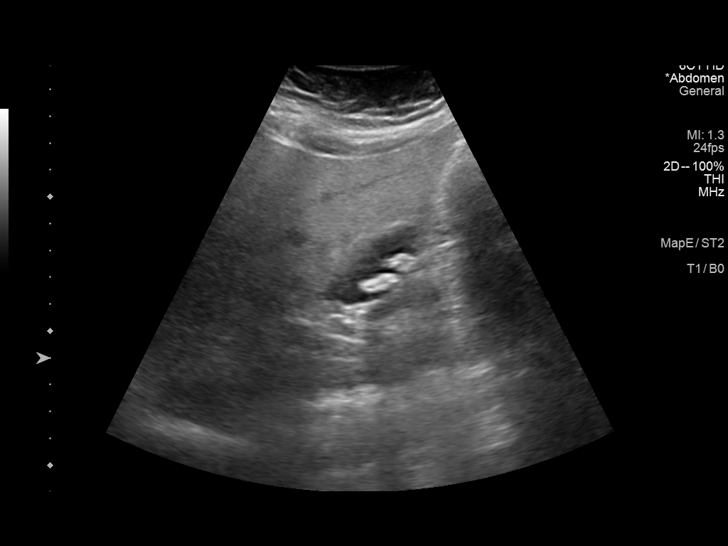
[im 7/41]
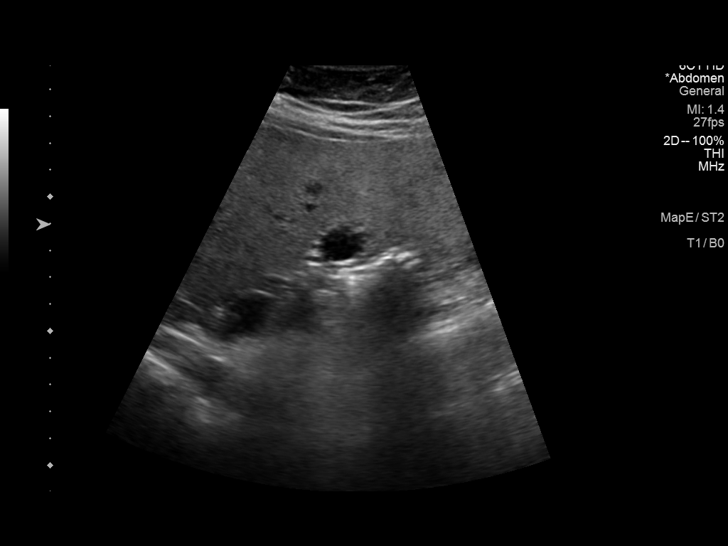
[im 11/41]
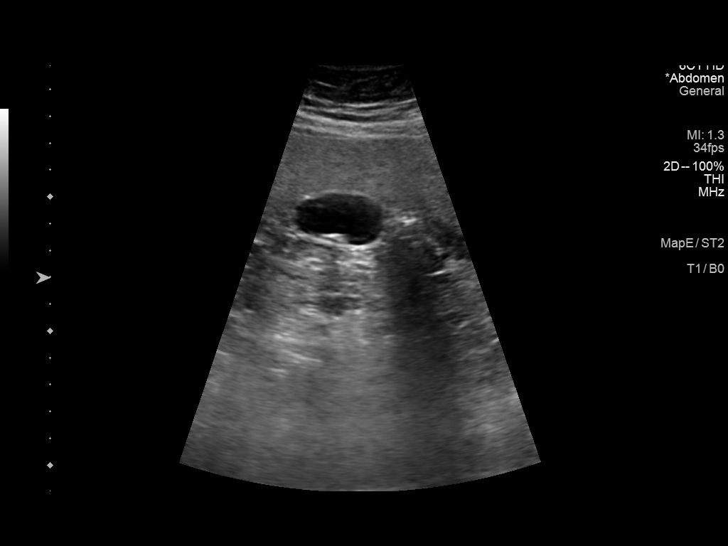
[im 14/41]
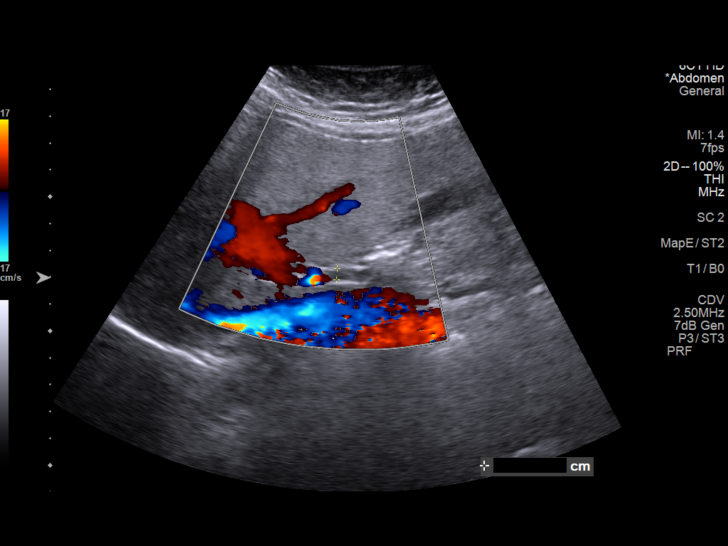
[im 16/41]
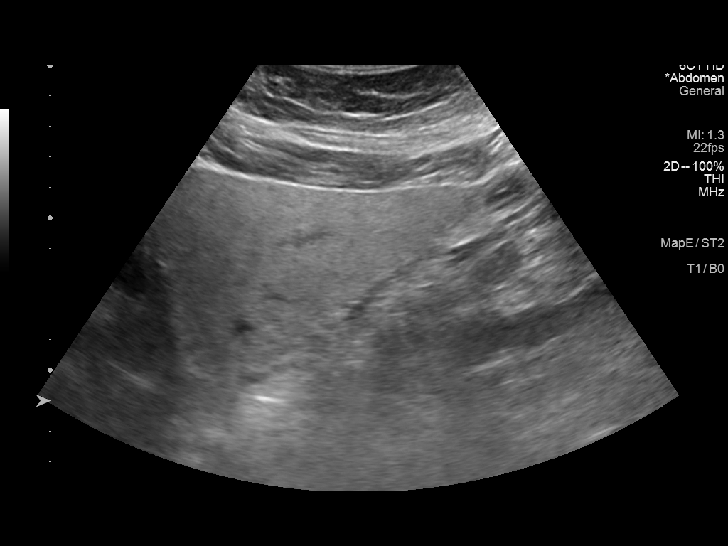
[im 19/41]
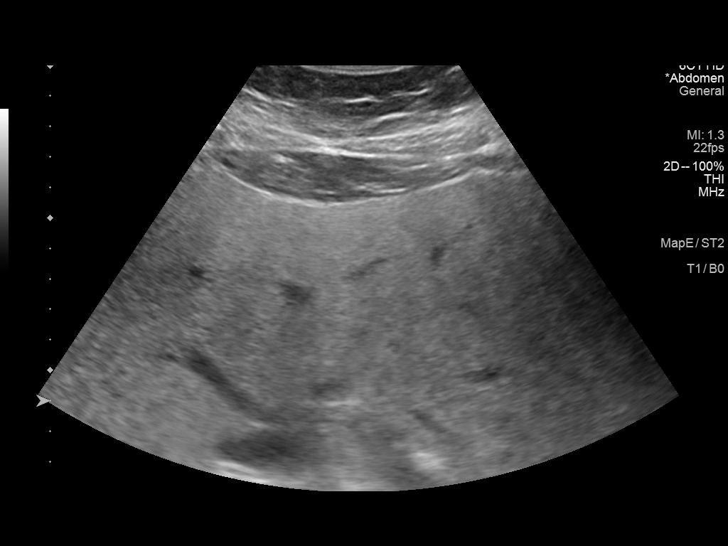
[im 22/41]
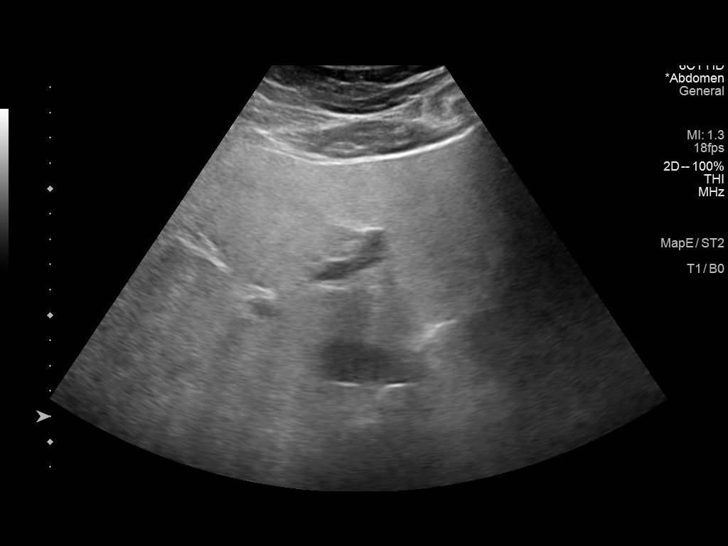
[im 26/41]
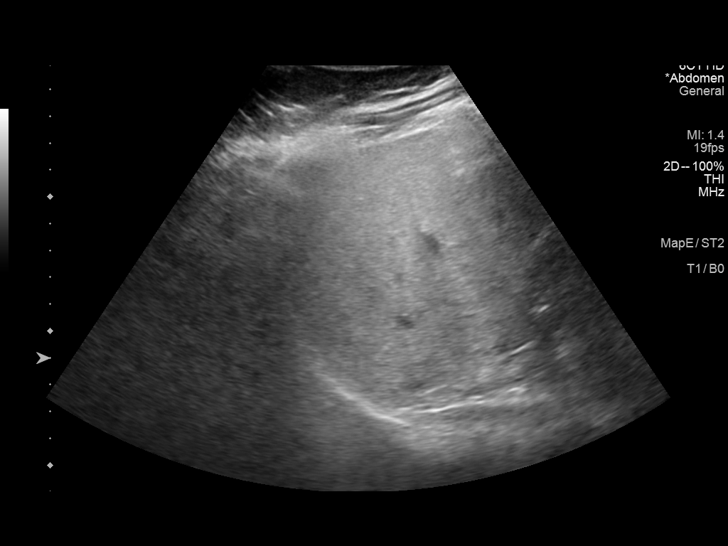
[im 27/41]
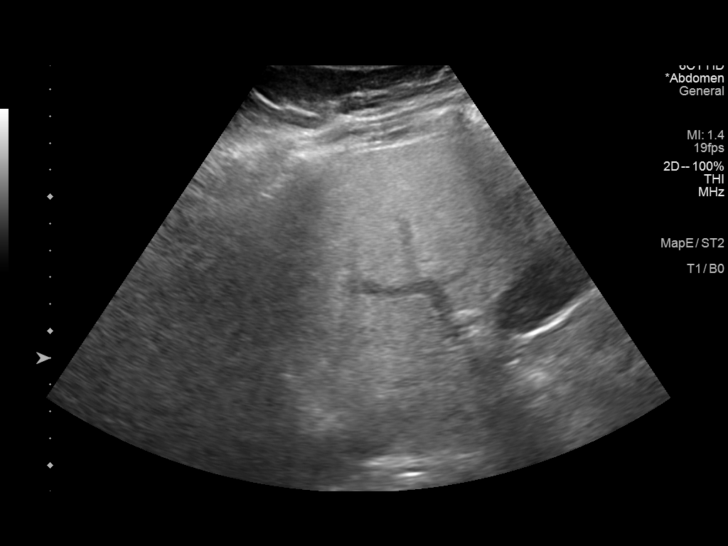
[im 31/41]
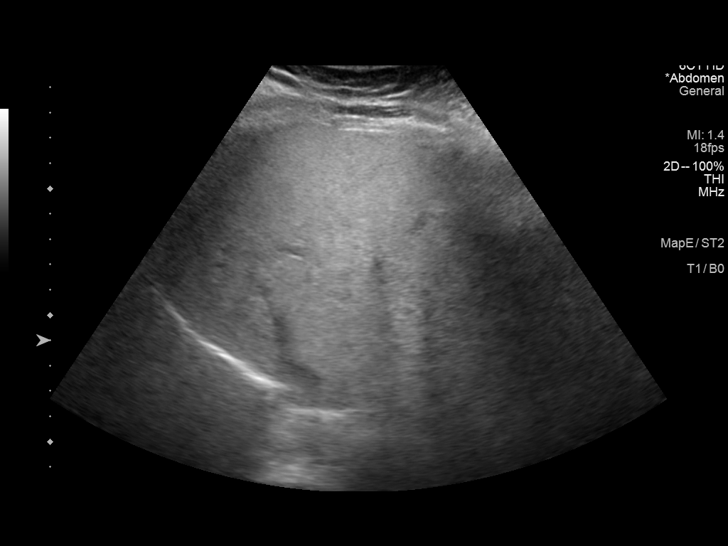
[im 34/41]
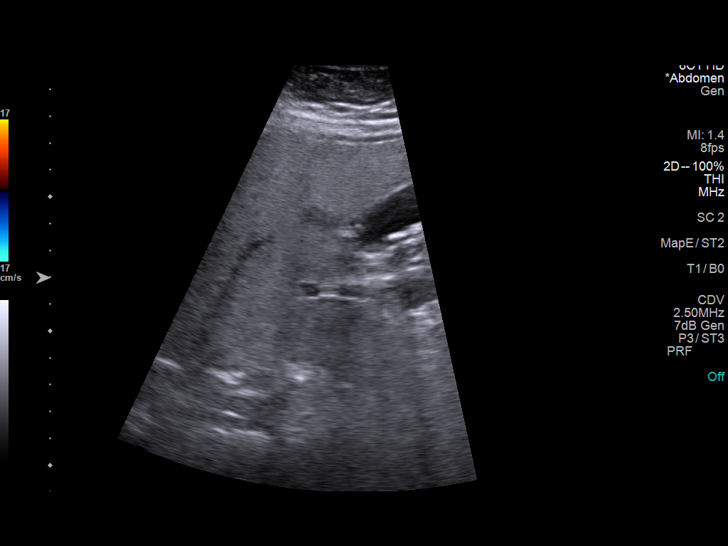
[im 37/41]
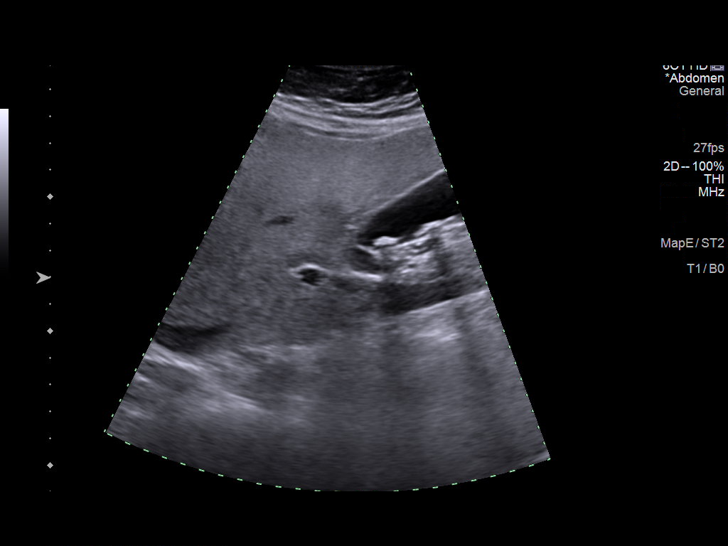
[im 41/41]
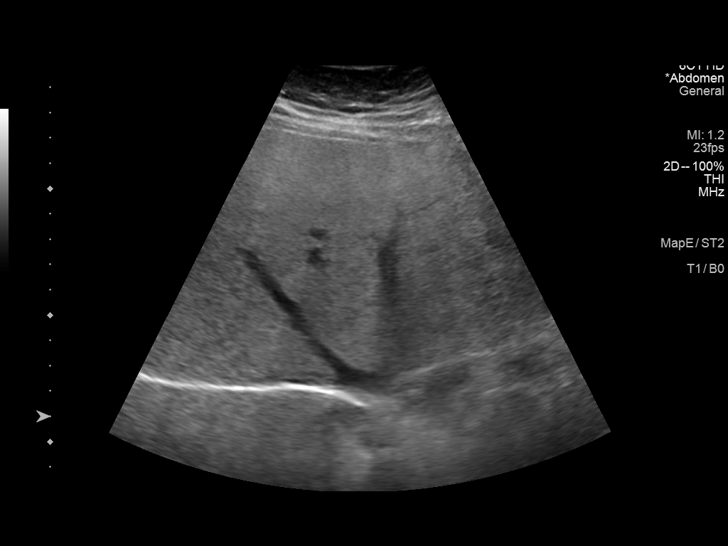

[14 of 25 positions shown; findings below may reference images not displayed]

FINDINGS: Gallbladder:

Cholelithiasis measuring up to 16 mm. No pericholecystic fluid or
wall thickening visualized. No sonographic Murphy sign noted by
sonographer.

Common bile duct:

Diameter: 4 mm

Liver:

Diffusely increased parenchymal echogenicity. Focal fatty sparing
along the gallbladder fossa and porta hepatis. Portal vein is patent
on color Doppler imaging with normal direction of blood flow towards
the liver.

Other: None.
IMPRESSION: 1. Cholelithiasis without sonographic evidence of acute
cholecystitis.
2. Hepatic steatosis with focal fatty sparing along the gallbladder
fossa and porta hepatis.

## 2024-03-23 ENCOUNTER — Other Ambulatory Visit: Payer: Self-pay | Admitting: Obstetrics and Gynecology

## 2024-03-23 DIAGNOSIS — Z1231 Encounter for screening mammogram for malignant neoplasm of breast: Secondary | ICD-10-CM

## 2024-06-12 ENCOUNTER — Other Ambulatory Visit: Payer: Self-pay | Admitting: Obstetrics and Gynecology

## 2024-06-12 DIAGNOSIS — Z1231 Encounter for screening mammogram for malignant neoplasm of breast: Secondary | ICD-10-CM

## 2024-07-21 ENCOUNTER — Ambulatory Visit: Payer: Self-pay

## 2024-07-28 ENCOUNTER — Ambulatory Visit
Admission: RE | Admit: 2024-07-28 | Discharge: 2024-07-28 | Disposition: A | Discharge status: Home / Self Care | Payer: Self-pay | Source: Ambulatory Visit | Attending: Obstetrics and Gynecology | Admitting: Obstetrics and Gynecology

## 2024-07-28 DIAGNOSIS — Z1231 Encounter for screening mammogram for malignant neoplasm of breast: Secondary | ICD-10-CM
